# Patient Record
Sex: Female | Born: 1965
Health system: Southern US, Community
[De-identification: ages and names within clinical notes are randomized; demographics above are authoritative.]

## PROBLEM LIST (undated history)

## (undated) DIAGNOSIS — B009 Herpesviral infection, unspecified: Secondary | ICD-10-CM

## (undated) DIAGNOSIS — D219 Benign neoplasm of connective and other soft tissue, unspecified: Secondary | ICD-10-CM

## (undated) HISTORY — DX: Herpesviral infection, unspecified: B00.9

## (undated) HISTORY — DX: Benign neoplasm of connective and other soft tissue, unspecified: D21.9

---

## 1998-07-19 ENCOUNTER — Encounter: Payer: Self-pay | Admitting: Obstetrics & Gynecology

## 1998-07-19 ENCOUNTER — Ambulatory Visit (HOSPITAL_COMMUNITY): Admission: RE | Admit: 1998-07-19 | Discharge: 1998-07-19 | Payer: Self-pay | Admitting: Obstetrics & Gynecology

## 1999-07-18 ENCOUNTER — Other Ambulatory Visit: Admission: RE | Admit: 1999-07-18 | Discharge: 1999-07-18 | Payer: Self-pay | Admitting: Obstetrics & Gynecology

## 2000-07-16 ENCOUNTER — Other Ambulatory Visit: Admission: RE | Admit: 2000-07-16 | Discharge: 2000-07-16 | Payer: Self-pay | Admitting: Obstetrics and Gynecology

## 2001-06-30 ENCOUNTER — Other Ambulatory Visit: Admission: RE | Admit: 2001-06-30 | Discharge: 2001-06-30 | Payer: Self-pay | Admitting: Obstetrics and Gynecology

## 2002-06-29 ENCOUNTER — Other Ambulatory Visit: Admission: RE | Admit: 2002-06-29 | Discharge: 2002-06-29 | Payer: Self-pay | Admitting: Obstetrics and Gynecology

## 2003-05-23 ENCOUNTER — Encounter: Admission: RE | Admit: 2003-05-23 | Discharge: 2003-05-23 | Payer: Self-pay | Admitting: Obstetrics and Gynecology

## 2003-07-06 ENCOUNTER — Other Ambulatory Visit: Admission: RE | Admit: 2003-07-06 | Discharge: 2003-07-06 | Payer: Self-pay | Admitting: Obstetrics and Gynecology

## 2003-12-07 ENCOUNTER — Encounter: Admission: RE | Admit: 2003-12-07 | Discharge: 2003-12-07 | Payer: Self-pay | Admitting: Obstetrics and Gynecology

## 2004-06-04 ENCOUNTER — Encounter: Admission: RE | Admit: 2004-06-04 | Discharge: 2004-06-04 | Payer: Self-pay | Admitting: Obstetrics and Gynecology

## 2004-07-11 ENCOUNTER — Other Ambulatory Visit: Admission: RE | Admit: 2004-07-11 | Discharge: 2004-07-11 | Payer: Self-pay | Admitting: Obstetrics and Gynecology

## 2005-06-06 ENCOUNTER — Encounter: Admission: RE | Admit: 2005-06-06 | Discharge: 2005-06-06 | Payer: Self-pay | Admitting: Obstetrics and Gynecology

## 2005-07-14 ENCOUNTER — Other Ambulatory Visit: Admission: RE | Admit: 2005-07-14 | Discharge: 2005-07-14 | Payer: Self-pay | Admitting: Obstetrics and Gynecology

## 2005-08-17 ENCOUNTER — Inpatient Hospital Stay (HOSPITAL_COMMUNITY): Admission: AD | Admit: 2005-08-17 | Discharge: 2005-08-17 | Payer: Self-pay | Admitting: Obstetrics and Gynecology

## 2005-10-15 ENCOUNTER — Ambulatory Visit (HOSPITAL_COMMUNITY): Admission: RE | Admit: 2005-10-15 | Discharge: 2005-10-15 | Payer: Self-pay | Admitting: Obstetrics and Gynecology

## 2006-06-12 ENCOUNTER — Emergency Department (HOSPITAL_COMMUNITY): Admission: EM | Admit: 2006-06-12 | Discharge: 2006-06-12 | Payer: Self-pay | Admitting: Emergency Medicine

## 2006-06-13 ENCOUNTER — Emergency Department (HOSPITAL_COMMUNITY): Admission: EM | Admit: 2006-06-13 | Discharge: 2006-06-13 | Payer: Self-pay | Admitting: Emergency Medicine

## 2006-06-30 ENCOUNTER — Encounter: Admission: RE | Admit: 2006-06-30 | Discharge: 2006-06-30 | Payer: Self-pay | Admitting: Obstetrics and Gynecology

## 2007-08-06 ENCOUNTER — Encounter: Admission: RE | Admit: 2007-08-06 | Discharge: 2007-08-06 | Payer: Self-pay | Admitting: Obstetrics and Gynecology

## 2008-02-18 ENCOUNTER — Ambulatory Visit: Payer: Self-pay | Admitting: Sports Medicine

## 2008-02-18 DIAGNOSIS — M2170 Unequal limb length (acquired), unspecified site: Secondary | ICD-10-CM | POA: Insufficient documentation

## 2008-02-18 DIAGNOSIS — IMO0002 Reserved for concepts with insufficient information to code with codable children: Secondary | ICD-10-CM | POA: Insufficient documentation

## 2008-08-21 ENCOUNTER — Encounter: Admission: RE | Admit: 2008-08-21 | Discharge: 2008-08-21 | Payer: Self-pay | Admitting: Obstetrics and Gynecology

## 2009-06-23 DIAGNOSIS — D219 Benign neoplasm of connective and other soft tissue, unspecified: Secondary | ICD-10-CM

## 2009-06-23 HISTORY — DX: Benign neoplasm of connective and other soft tissue, unspecified: D21.9

## 2009-09-05 ENCOUNTER — Encounter: Admission: RE | Admit: 2009-09-05 | Discharge: 2009-09-05 | Payer: Self-pay | Admitting: Obstetrics and Gynecology

## 2010-07-13 ENCOUNTER — Other Ambulatory Visit: Payer: Self-pay | Admitting: Obstetrics and Gynecology

## 2010-07-13 DIAGNOSIS — Z1239 Encounter for other screening for malignant neoplasm of breast: Secondary | ICD-10-CM

## 2010-07-14 ENCOUNTER — Encounter: Payer: Self-pay | Admitting: Obstetrics and Gynecology

## 2010-09-11 ENCOUNTER — Other Ambulatory Visit: Payer: Self-pay | Admitting: Obstetrics and Gynecology

## 2010-09-11 ENCOUNTER — Ambulatory Visit
Admission: RE | Admit: 2010-09-11 | Discharge: 2010-09-11 | Disposition: A | Discharge status: Home / Self Care | Payer: 59 | Source: Ambulatory Visit | Attending: Obstetrics and Gynecology | Admitting: Obstetrics and Gynecology

## 2010-09-11 DIAGNOSIS — Z1231 Encounter for screening mammogram for malignant neoplasm of breast: Secondary | ICD-10-CM

## 2010-09-11 DIAGNOSIS — Z1239 Encounter for other screening for malignant neoplasm of breast: Secondary | ICD-10-CM

## 2010-10-03 ENCOUNTER — Ambulatory Visit
Admission: RE | Admit: 2010-10-03 | Discharge: 2010-10-03 | Disposition: A | Discharge status: Home / Self Care | Payer: 59 | Source: Ambulatory Visit | Attending: Obstetrics and Gynecology | Admitting: Obstetrics and Gynecology

## 2010-10-03 DIAGNOSIS — Z1231 Encounter for screening mammogram for malignant neoplasm of breast: Secondary | ICD-10-CM

## 2010-11-08 NOTE — Consult Note (Signed)
Stephanie Schneider, Stephanie Schneider NO.:  [id number]   MEDICAL RECORD NO.:  [id number]          PATIENT TYPE:  MAT   LOCATION:  [facility]                          FACILITY:  [facility]   PHYSICIAN:  Janine Limbo, M.D.DATE OF BIRTH:  Oct 28, 1965   DATE OF CONSULTATION:  08/17/2005  DATE OF DISCHARGE:                                   CONSULTATION   HISTORY OF PRESENT ILLNESS:  [name] is a 45 year old female who  presents for her first intrauterine insemination. She had appropriate  hormone surges. The patient's husband's post sperm wash analysis shows a  volume of 0.5 mL. There is 1+ forward progression. There are 33 million  sperm per milliliter. There is 42% motility.   The patient was seen at maternity admissions. Vital signs were all stable.  Exam was performed. The sample was placed in the uterus without difficulty.  The patient will remain supine for 30 minutes and will return to the office  in 10 days for examination and for pregnancy testing.      Janine Limbo, M.D.  Electronically Signed     AVS/MEDQ  D:  08/17/2005  T:  08/18/2005  Job:  [id number]

## 2011-08-29 ENCOUNTER — Other Ambulatory Visit: Payer: Self-pay | Admitting: Obstetrics and Gynecology

## 2011-08-29 DIAGNOSIS — Z1231 Encounter for screening mammogram for malignant neoplasm of breast: Secondary | ICD-10-CM

## 2011-09-10 DIAGNOSIS — B009 Herpesviral infection, unspecified: Secondary | ICD-10-CM | POA: Insufficient documentation

## 2011-09-10 DIAGNOSIS — E28319 Asymptomatic premature menopause: Secondary | ICD-10-CM | POA: Insufficient documentation

## 2011-10-01 ENCOUNTER — Ambulatory Visit (INDEPENDENT_AMBULATORY_CARE_PROVIDER_SITE_OTHER): Payer: 59 | Admitting: Obstetrics and Gynecology

## 2011-10-01 ENCOUNTER — Encounter: Payer: Self-pay | Admitting: Obstetrics and Gynecology

## 2011-10-01 ENCOUNTER — Ambulatory Visit
Admission: RE | Admit: 2011-10-01 | Discharge: 2011-10-01 | Disposition: A | Discharge status: Home / Self Care | Payer: 59 | Source: Ambulatory Visit | Attending: Obstetrics and Gynecology | Admitting: Obstetrics and Gynecology

## 2011-10-01 VITALS — BP 118/68 | HR 82 | Ht 67.0 in | Wt 151.0 lb

## 2011-10-01 DIAGNOSIS — Z1231 Encounter for screening mammogram for malignant neoplasm of breast: Secondary | ICD-10-CM

## 2011-10-01 DIAGNOSIS — Z124 Encounter for screening for malignant neoplasm of cervix: Secondary | ICD-10-CM

## 2011-10-01 DIAGNOSIS — Z01419 Encounter for gynecological examination (general) (routine) without abnormal findings: Secondary | ICD-10-CM

## 2011-10-01 MED ORDER — VALACYCLOVIR HCL 500 MG PO TABS: 500.0000 mg | ORAL_TABLET | Freq: Two times a day (BID) | ORAL | Status: DC | PRN

## 2011-10-01 NOTE — Progress Notes (Signed)
Subjective:    Stephanie Schneider is a 46 y.o. female, G3P1010, who presents for an annual exam. The patient reports:no complaints. FSH 35 05/2011    History  Sexual Activity  . Sexually Active: Yes -- Female partner(s)  . Birth Tax adviser: None  .  History  Smoking status  . Never Smoker   Smokeless tobacco  . Not on file  .  Last mammogram: was normal  2012 Last pap: was normal   2012  GC/Chlamydia cultures offered: declined HIV/RPR/HbsAg offered:  declined HSV 1 and 2 glycoprotein offered: declined  Menstrual cycle:   LMP: Patient's last menstrual period was 09/17/2011.           Cycle is monthly with normal flow and without intermenstrual bleeding or severe dysmenorrhea. Occasional heavy flow with cramping.   The following portions of the patient's history were reviewed and updated as appropriate: allergies, current medications, past family history, past medical history, past social history, past surgical history and problem list.  Review of Systems Pertinent items are noted in HPI. Breast:Negative for breast lump,nipple discharge or nipple retraction Gastrointestinal: positive for constipation worse with traveling Urinary:negative    Objective:    BP 118/68  Pulse 82  Ht 5\' 7"  (1.702 m)  Wt 151 lb (68.493 kg)  BMI 23.65 kg/m2  LMP 09/17/2011 Weight:  Wt Readings from Last 1 Encounters:  10/01/11 151 lb (68.493 kg)   BMI: Body mass index is 23.65 kg/(m^2). General Appearance: Alert, appropriate appearance for age. No acute distress HEENT: Grossly normal Neck / Thyroid: Supple, no masses, nodes or enlargement Lungs: clear to auscultation bilaterally Back: No CVA tenderness Breast Exam: No masses or nodes.No dimpling, nipple retraction or discharge. Cardiovascular: Regular rate and rhythm. S1, S2, no murmur Gastrointestinal: Soft, non-tender, no masses or organomegaly Pelvic Exam: External genitalia: normal general appearance Vaginal: normal mucosa  without prolapse or lesions Cervix: normal appearance Adnexa: normal bimanual exam Uterus: enlarged 10-12 weeks NSC Rectovaginal: normal rectal, no masses Lymphatic Exam: Non-palpable nodes in neck, clavicular, axillary, or inguinal regions Skin: no rash or abnormalities Neurologic: Normal gait and speech, no tremor   Psychiatric: Alert and oriented, appropriate affect.   Wet Prep:not applicable Urinalysis:not applicable UPT: Not done   Assessment:    Normal gyn exam    Plan:    mammogram pap smear STD screening: declined Contraception:no method      Jadon Ressler AMD

## 2011-10-02 MED ORDER — VALACYCLOVIR HCL 500 MG PO TABS: 500.0000 mg | ORAL_TABLET | Freq: Two times a day (BID) | ORAL | Status: DC | PRN

## 2011-10-02 MED ORDER — VALACYCLOVIR HCL 500 MG PO TABS: 500.0000 mg | ORAL_TABLET | Freq: Two times a day (BID) | ORAL | Status: AC | PRN

## 2011-10-02 NOTE — Progress Notes (Signed)
Addended by: Larwance Rote on: 10/02/2011 11:53 AM   Modules accepted: Orders

## 2011-10-03 LAB — PAP IG W/ RFLX HPV ASCU

## 2012-05-25 ENCOUNTER — Telehealth: Payer: Self-pay | Admitting: Obstetrics and Gynecology

## 2012-05-25 NOTE — Telephone Encounter (Signed)
Per SR. I called in RF's on Boric acid 600 mg caps. #24 with prn RF's through 09/2012 to use as directed. Melody Comas A

## 2012-05-27 ENCOUNTER — Telehealth: Payer: Self-pay

## 2012-05-27 NOTE — Telephone Encounter (Signed)
Late entry for 05/25/12 Refills called in to [facility] through 09/2012 for the Boric acid  600 mg caps #24  To use as directed previously. Per SR. Melody Comas A

## 2012-06-30 ENCOUNTER — Other Ambulatory Visit: Payer: Self-pay | Admitting: Obstetrics and Gynecology

## 2012-06-30 DIAGNOSIS — Z1231 Encounter for screening mammogram for malignant neoplasm of breast: Secondary | ICD-10-CM

## 2012-08-07 ENCOUNTER — Other Ambulatory Visit: Payer: Self-pay

## 2012-10-06 ENCOUNTER — Ambulatory Visit
Admission: RE | Admit: 2012-10-06 | Discharge: 2012-10-06 | Disposition: A | Discharge status: Home / Self Care | Payer: 59 | Source: Ambulatory Visit | Attending: Obstetrics and Gynecology | Admitting: Obstetrics and Gynecology

## 2012-10-06 ENCOUNTER — Other Ambulatory Visit (HOSPITAL_COMMUNITY)
Admission: RE | Admit: 2012-10-06 | Discharge: 2012-10-06 | Disposition: A | Discharge status: Home / Self Care | Payer: 59 | Source: Ambulatory Visit | Attending: Obstetrics and Gynecology | Admitting: Obstetrics and Gynecology

## 2012-10-06 ENCOUNTER — Other Ambulatory Visit: Payer: Self-pay | Admitting: Obstetrics and Gynecology

## 2012-10-06 DIAGNOSIS — Z01419 Encounter for gynecological examination (general) (routine) without abnormal findings: Secondary | ICD-10-CM | POA: Insufficient documentation

## 2012-10-06 DIAGNOSIS — Z1231 Encounter for screening mammogram for malignant neoplasm of breast: Secondary | ICD-10-CM

## 2012-10-06 DIAGNOSIS — Z1151 Encounter for screening for human papillomavirus (HPV): Secondary | ICD-10-CM | POA: Insufficient documentation

## 2013-04-28 ENCOUNTER — Other Ambulatory Visit: Payer: Self-pay

## 2013-08-30 ENCOUNTER — Other Ambulatory Visit: Payer: Self-pay

## 2013-08-30 DIAGNOSIS — Z1231 Encounter for screening mammogram for malignant neoplasm of breast: Secondary | ICD-10-CM

## 2013-10-12 ENCOUNTER — Ambulatory Visit
Admission: RE | Admit: 2013-10-12 | Discharge: 2013-10-12 | Disposition: A | Discharge status: Home / Self Care | Payer: 59 | Source: Ambulatory Visit

## 2013-10-12 DIAGNOSIS — Z1231 Encounter for screening mammogram for malignant neoplasm of breast: Secondary | ICD-10-CM

## 2014-04-24 ENCOUNTER — Encounter: Payer: Self-pay | Admitting: Obstetrics and Gynecology

## 2014-09-06 ENCOUNTER — Other Ambulatory Visit: Payer: Self-pay

## 2014-09-06 DIAGNOSIS — Z1231 Encounter for screening mammogram for malignant neoplasm of breast: Secondary | ICD-10-CM

## 2014-10-16 ENCOUNTER — Ambulatory Visit
Admission: RE | Admit: 2014-10-16 | Discharge: 2014-10-16 | Disposition: A | Discharge status: Home / Self Care | Payer: 59 | Source: Ambulatory Visit

## 2014-10-16 DIAGNOSIS — Z1231 Encounter for screening mammogram for malignant neoplasm of breast: Secondary | ICD-10-CM

## 2014-11-19 ENCOUNTER — Telehealth: Payer: Self-pay | Admitting: Family

## 2014-11-19 DIAGNOSIS — R059 Cough, unspecified: Secondary | ICD-10-CM

## 2014-11-19 DIAGNOSIS — R05 Cough: Secondary | ICD-10-CM

## 2014-11-19 MED ORDER — BENZONATATE 100 MG PO CAPS: 100.0000 mg | ORAL_CAPSULE | Freq: Three times a day (TID) | ORAL | Status: DC | PRN

## 2014-11-19 NOTE — Progress Notes (Signed)
We are sorry that you are not feeling well.  Here is how we plan to help!  Based on what you have shared with me it looks like you have upper respiratory tract inflammation that has resulted in a significant cough.  Inflammation and infection in the upper respiratory tract is commonly called bronchitis and has four common causes:  Allergies, Viral Infections, Acid Reflux and Bacterial Infections.  Allergies, viruses and acid reflux are treated by controlling symptoms or eliminating the cause. An example might be a cough caused by taking certain blood pressure medications. You stop the cough by changing the medication. Another example might be a cough caused by acid reflux. Controlling the reflux helps control the cough.  Based on your presentation I believe you most likely have A cough due to a virus.  This is called viral bronchitis and is best treated by rest, plenty of fluids and control of the cough.  You may use Ibuprofen or Tylenol as directed to help your symptoms.    In addition you may use A non-prescription cough medication called Robitussin DAC. Take 2 teaspoons every 8 hours or Delsym: take 2 teaspoons every 12 hours., A non-prescription cough medication called Mucinex DM: take 2 tablets every 12 hours. and A prescription cough medication called Tessalon Perles 100mg. You may take 1-2 capsules every 8 hours as needed for your cough.    HOME CARE . Only take medications as instructed by your medical team. . Complete the entire course of an antibiotic. . Drink plenty of fluids and get plenty of rest. . Avoid close contacts especially the very young and the elderly . Cover your mouth if you cough or cough into your sleeve. . Always remember to wash your hands . A steam or ultrasonic humidifier can help congestion.    GET HELP RIGHT AWAY IF: . You develop worsening fever. . You become short of breath . You cough up blood. . Your symptoms persist after you have completed your treatment  plan MAKE SURE YOU   Understand these instructions.  Will watch your condition.  Will get help right away if you are not doing well or get worse.  Your e-visit answers were reviewed by a board certified advanced clinical practitioner to complete your personal care plan.  Depending on the condition, your plan could have included both over the counter or prescription medications.  If there is a problem please reply  once you have received a response from your provider.  Your safety is important to us.  If you have drug allergies check your prescription carefully.    You can use MyChart to ask questions about today's visit, request a non-urgent call back, or ask for a work or school excuse.  You will get an e-mail in the next two days asking about your experience.  I hope that your e-visit has been valuable and will speed your recovery. Thank you for using e-visits.   

## 2014-12-19 ENCOUNTER — Ambulatory Visit (INDEPENDENT_AMBULATORY_CARE_PROVIDER_SITE_OTHER): Payer: 59 | Admitting: Family Medicine

## 2014-12-19 ENCOUNTER — Encounter: Payer: Self-pay | Admitting: Family Medicine

## 2014-12-19 VITALS — BP 125/85 | HR 82 | Ht 67.0 in | Wt 145.0 lb

## 2014-12-19 DIAGNOSIS — M25552 Pain in left hip: Secondary | ICD-10-CM | POA: Diagnosis not present

## 2014-12-19 DIAGNOSIS — M25551 Pain in right hip: Secondary | ICD-10-CM

## 2014-12-19 DIAGNOSIS — F419 Anxiety disorder, unspecified: Secondary | ICD-10-CM | POA: Insufficient documentation

## 2014-12-19 DIAGNOSIS — G43909 Migraine, unspecified, not intractable, without status migrainosus: Secondary | ICD-10-CM | POA: Insufficient documentation

## 2014-12-19 NOTE — Assessment & Plan Note (Signed)
location, description consistent with snapping hip syndrome of hip flexors.  Shown home exercises and stretches to do daily.  Tylenol, motrin, icing if needed.  F/u in 6 weeks.  Consider PT if not improving.

## 2014-12-19 NOTE — Progress Notes (Signed)
PCP: Patria Mane, MD  Subjective:   HPI: Patient is a 49 y.o. female here for bilateral hip pain.  Patient reports she's been having off and on bilateral hip pain mainly with extreme hip flexion. Plans to start running again soon and wants to make sure nothing bad is going on. No swelling or bruising. No known injury. Feels like it will cramp sometimes. Can get up to 3/10 pain when it comes on but pain does not linger. No radiation. No numbness or tingling.  Past Medical History  Diagnosis Date  . Fibroid 2011    asymptomatic  . HSV-2 infection     Current Outpatient Prescriptions on File Prior to Visit  Medication Sig Dispense Refill  . ALPRAZolam (XANAX) 0.25 MG tablet Take 0.25 mg by mouth as needed.    . SUMAtriptan (IMITREX) 100 MG tablet Take 50 mg by mouth as needed.    . triamcinolone (NASACORT) 55 MCG/ACT nasal inhaler Place 1 spray into the nose daily.     No current facility-administered medications on file prior to visit.    No past surgical history on file.  No Known Allergies  History   Social History  . Marital Status: Married    Spouse Name: N/A  . Number of Children: N/A  . Years of Education: N/A   Occupational History  . Not on file.   Social History Main Topics  . Smoking status: Never Smoker   . Smokeless tobacco: Not on file  . Alcohol Use: No  . Drug Use: No  . Sexual Activity:    Partners: Male    Birth Control/ Protection: None   Other Topics Concern  . Not on file   Social History Narrative    Family History  Problem Relation Age of Onset  . Breast cancer Mother 3    BP 125/85 mmHg  Pulse 82  Ht 5\' 7"  (1.702 m)  Wt 145 lb (65.772 kg)  BMI 22.71 kg/m2  Review of Systems: See HPI above.    Objective:  Physical Exam:  Gen: NAD  Back/hips: No gross deformity, scoliosis. No TTP.  No midline or bony TTP. FROM hips and back without pain.   Strength LEs 5/5 all muscle groups except 5-/5 bilateral hip abduction.   No pain with any motions.   2+ MSRs in patellar and achilles tendons, equal bilaterally. Negative SLRs. Sensation intact to light touch bilaterally. Negative logroll bilateral hips Negative fabers and piriformis stretches. Leg lengths equal.  Assessment & Plan:  1. Bilateral hip pain - location, description consistent with snapping hip syndrome of hip flexors.  Shown home exercises and stretches to do daily.  Tylenol, motrin, icing if needed.  F/u in 6 weeks.  Consider PT if not improving.

## 2014-12-19 NOTE — Patient Instructions (Signed)
You are describing snapping hip syndrome. Your exam is very reassuring. I would not put any restrictions on your running. Hip flexor stretches 2-3 times, hold 20-30 seconds. Standing hip rotations, straight leg raises, hip side raises 3 sets of 10 once a day. Add ankle weights if these become too easy. Tylenol, motrin, icing only if needed. Follow up with me in 6 weeks. Can consider physical therapy if you're struggling.

## 2015-08-15 MED FILL — NASACORT ALLERGY 24HR SPRAY: 55 MCG | 30 days supply | Qty: 11 | Fill #0 | Status: TO

## 2015-08-16 MED FILL — AMOX TR-K CLV 875-125 MG TA: 875-125 | 10 days supply | Qty: 20 | Fill #0

## 2015-09-11 ENCOUNTER — Other Ambulatory Visit: Payer: Self-pay

## 2015-09-11 DIAGNOSIS — Z1231 Encounter for screening mammogram for malignant neoplasm of breast: Secondary | ICD-10-CM

## 2015-09-17 MED FILL — OSELTAMIVIR PHOS 75 MG CAP: 75 | 5 days supply | Qty: 10 | Fill #0

## 2015-09-17 MED FILL — PROMETHAZINE-CODEINE SYRUP: 6.25-10 | 5 days supply | Qty: 100 | Fill #0

## 2015-10-11 MED FILL — NASACORT ALLERGY 24HR SPRAY: 55 MCG | 30 days supply | Qty: 11 | Fill #0

## 2015-10-18 ENCOUNTER — Other Ambulatory Visit: Payer: Self-pay | Admitting: Obstetrics and Gynecology

## 2015-10-18 ENCOUNTER — Other Ambulatory Visit (HOSPITAL_COMMUNITY)
Admission: RE | Admit: 2015-10-18 | Discharge: 2015-10-18 | Disposition: A | Discharge status: Home / Self Care | Payer: 59 | Source: Ambulatory Visit | Attending: Obstetrics and Gynecology | Admitting: Obstetrics and Gynecology

## 2015-10-18 ENCOUNTER — Ambulatory Visit
Admission: RE | Admit: 2015-10-18 | Discharge: 2015-10-18 | Disposition: A | Discharge status: Home / Self Care | Payer: 59 | Source: Ambulatory Visit

## 2015-10-18 DIAGNOSIS — Z1231 Encounter for screening mammogram for malignant neoplasm of breast: Secondary | ICD-10-CM

## 2015-10-18 DIAGNOSIS — Z1151 Encounter for screening for human papillomavirus (HPV): Secondary | ICD-10-CM | POA: Diagnosis present

## 2015-10-18 DIAGNOSIS — Z01419 Encounter for gynecological examination (general) (routine) without abnormal findings: Secondary | ICD-10-CM | POA: Diagnosis present

## 2015-10-19 LAB — CYTOLOGY - PAP

## 2015-12-04 MED FILL — NASACORT ALLERGY 24HR SPRAY: 55 MCG | 30 days supply | Qty: 11 | Fill #1

## 2015-12-26 MED FILL — CLOBETASOL 0.05% SOLUTION: 0.05 | 15 days supply | Qty: 25 | Fill #0

## 2016-01-04 MED FILL — VALACYCLOVIR HCL 500 MG TAB: 500 | 7 days supply | Qty: 30 | Fill #0

## 2016-02-18 MED FILL — TRIAMCINOLONE 55 MCG NASAL: 55 | 60 days supply | Qty: 17 | Fill #2

## 2016-03-18 MED FILL — LOTEMAX 0.5% GEL: 0.5 | 25 days supply | Qty: 5 | Fill #0

## 2016-04-08 MED FILL — HYDROCODONE-HOMATROPINE SYR: 5-1.5 | 6 days supply | Qty: 120 | Fill #0

## 2016-05-05 MED FILL — BENZONATATE 100 MG CAPSULE: 100 | 7 days supply | Qty: 21 | Fill #0

## 2016-05-05 MED FILL — AMOX-CLAV 875-125 MG TABLET: 875-125 | 10 days supply | Qty: 20 | Fill #0

## 2016-06-10 MED FILL — TRIAMCINOLONE 55 MCG NASAL: 55 | 60 days supply | Qty: 17 | Fill #3

## 2016-07-17 MED FILL — OSELTAMIVIR PHOS 75 MG CAP: 75 | 10 days supply | Qty: 10 | Fill #0

## 2016-08-13 MED FILL — TRIAMCINOLONE 55 MCG NASAL: 55 | 30 days supply | Qty: 17 | Fill #4

## 2016-08-18 DIAGNOSIS — H6122 Impacted cerumen, left ear: Secondary | ICD-10-CM | POA: Diagnosis not present

## 2016-08-18 DIAGNOSIS — H9012 Conductive hearing loss, unilateral, left ear, with unrestricted hearing on the contralateral side: Secondary | ICD-10-CM | POA: Diagnosis not present

## 2016-09-03 ENCOUNTER — Encounter (INDEPENDENT_AMBULATORY_CARE_PROVIDER_SITE_OTHER): Payer: Self-pay

## 2016-09-03 ENCOUNTER — Encounter: Payer: Self-pay | Admitting: Family Medicine

## 2016-09-03 ENCOUNTER — Ambulatory Visit (INDEPENDENT_AMBULATORY_CARE_PROVIDER_SITE_OTHER): Payer: 59 | Admitting: Family Medicine

## 2016-09-03 DIAGNOSIS — M25532 Pain in left wrist: Secondary | ICD-10-CM

## 2016-09-03 DIAGNOSIS — M25531 Pain in right wrist: Secondary | ICD-10-CM | POA: Diagnosis not present

## 2016-09-03 DIAGNOSIS — M25551 Pain in right hip: Secondary | ICD-10-CM | POA: Diagnosis not present

## 2016-09-03 NOTE — Patient Instructions (Signed)
Your wrist pain is suggestive of a small TFCC tear of your wrist. Wear the wrist brace on right as often as possible for the next 6 weeks. Icing, tylenol or aleve only if needed. If not improving only then would I consider occupational therapy, MRI.  Your hip pain is due to trochanteric bursitis. Avoid painful activities as much as possible (stairs, inclines). Ice over area of pain 3-4 times a day for 15 minutes at a time Hip side raise exercise, standing hip rotations 3 sets of 10-15 once a day - add weights if these become too easy. Stretches - pick 2 and hold for 20-30 seconds x 3 - do once or twice a day. Regular aleve or ibuprofen for 7-10 days then as needed. If not improving, can consider physical therapy and/or steroid injection. Follow up with me in 6 weeks but can call me sooner if you're having problems.

## 2016-09-04 DIAGNOSIS — M25531 Pain in right wrist: Secondary | ICD-10-CM | POA: Insufficient documentation

## 2016-09-04 DIAGNOSIS — M25532 Pain in left wrist: Principal | ICD-10-CM

## 2016-09-04 NOTE — Assessment & Plan Note (Signed)
2/2 trochanteric bursitis. Shown home exercises and stretches to do daily.  Icing, tylenol or aleve if needed.  Consider physical therapy, injection if not improving.  f/u in 6 weeks.

## 2016-09-04 NOTE — Progress Notes (Signed)
PCP: Lilian Coma, MD  Subjective:   HPI: Patient is a 51 y.o. female here for right hip, bilateral wrist pain.  Patient reports she's had problems with both wrists for several months off and on. Right wrist worse than left. Comes and goes. No clicking or popping. Feels more on ulnar side of the wrist. Pain currently 0/10 level. Both feel weak. Worse when opening jars. No swelling. Also with right hip pain since Sunday.  Was lying on back in bed when had a sharp pain lateral right hip. Pain only comes on for 5 seconds at a time and goes away. Does not come on with any motions in particular. No numbness or tingling. No radiation of pain. No skin changes.  Past Medical History:  Diagnosis Date  . Fibroid 2011   asymptomatic  . HSV-2 infection     Current Outpatient Prescriptions on File Prior to Visit  Medication Sig Dispense Refill  . ALPRAZolam (XANAX) 0.25 MG tablet Take 0.25 mg by mouth as needed.    . SUMAtriptan (IMITREX) 100 MG tablet Take 50 mg by mouth as needed.     No current facility-administered medications on file prior to visit.     No past surgical history on file.  No Known Allergies  Social History   Social History  . Marital status: Married    Spouse name: N/A  . Number of children: N/A  . Years of education: N/A   Occupational History  . Not on file.   Social History Main Topics  . Smoking status: Never Smoker  . Smokeless tobacco: Never Used  . Alcohol use No  . Drug use: No  . Sexual activity: Yes    Partners: Male    Birth control/ protection: None   Other Topics Concern  . Not on file   Social History Narrative  . No narrative on file    Family History  Problem Relation Age of Onset  . Breast cancer Mother 37    BP (!) 133/96   Pulse 69   Ht 5\' 7"  (1.702 m)   Wt 155 lb (70.3 kg)   BMI 24.28 kg/m   Review of Systems: See HPI above.     Objective:  Physical Exam:  Gen: NAD, comfortable in exam  room  Right hip/back: No gross deformity, scoliosis. TTP right greater trochanter.  No midline or bony TTP.  No other hip tenderness. FROM without pain. Strength LEs 5/5 all muscle groups including hip abduction.   Negative SLRs. Sensation intact to light touch bilaterally. Negative logroll bilateral hips Negative fabers and piriformis stretches.   Bilateral wrists: No gross deformity, swelling, bruising. No tenderness (points to about TFCC as area of pain when occurs). FROM wrists, digits without pain. 5/5 strength wrists and digits. Negative finkelsteins, phalens, tinels. NVI distally.  Assessment & Plan:  1. Right hip pain - 2/2 trochanteric bursitis. Shown home exercises and stretches to do daily.  Icing, tylenol or aleve if needed.  Consider physical therapy, injection if not improving.  f/u in 6 weeks.  2. Bilateral wrist pain - mainly on the right - suggestive of TFCC tear on right vs tendinitis.  Start with wrist brace regularly.  Icing, tylenol or ibuprofen only if needed.  Consider occupational therapy, MRI if not improving.

## 2016-09-04 NOTE — Assessment & Plan Note (Signed)
mainly on the right - suggestive of TFCC tear on right vs tendinitis.  Start with wrist brace regularly.  Icing, tylenol or ibuprofen only if needed.  Consider occupational therapy, MRI if not improving.

## 2016-09-05 ENCOUNTER — Other Ambulatory Visit: Payer: Self-pay | Admitting: Obstetrics and Gynecology

## 2016-09-05 DIAGNOSIS — Z1231 Encounter for screening mammogram for malignant neoplasm of breast: Secondary | ICD-10-CM

## 2016-09-18 MED FILL — VALACYCLOVIR HCL 500 MG TAB: 500 | 7 days supply | Qty: 30 | Fill #1 | Status: TO

## 2016-10-13 MED FILL — SUMATRIPTAN SUCC 100 MG TAB: 100 | 30 days supply | Qty: 9 | Fill #0 | Status: TO

## 2016-10-16 DIAGNOSIS — D509 Iron deficiency anemia, unspecified: Secondary | ICD-10-CM | POA: Diagnosis not present

## 2016-10-16 DIAGNOSIS — Z0000 Encounter for general adult medical examination without abnormal findings: Secondary | ICD-10-CM | POA: Diagnosis not present

## 2016-10-16 DIAGNOSIS — Z13220 Encounter for screening for lipoid disorders: Secondary | ICD-10-CM | POA: Diagnosis not present

## 2016-10-16 DIAGNOSIS — Z23 Encounter for immunization: Secondary | ICD-10-CM | POA: Diagnosis not present

## 2016-10-16 MED FILL — INTEGRA CAPSULE: 62.5-62.5-4 | 30 days supply | Qty: 30 | Fill #0

## 2016-10-17 MED FILL — NASACORT ALLERGY 24HR SPRAY: 55 MCG | 30 days supply | Qty: 11 | Fill #0 | Status: TO

## 2016-10-20 ENCOUNTER — Ambulatory Visit
Admission: RE | Admit: 2016-10-20 | Discharge: 2016-10-20 | Disposition: A | Discharge status: Home / Self Care | Payer: 59 | Source: Ambulatory Visit | Attending: Obstetrics and Gynecology | Admitting: Obstetrics and Gynecology

## 2016-10-20 DIAGNOSIS — Z1231 Encounter for screening mammogram for malignant neoplasm of breast: Secondary | ICD-10-CM | POA: Diagnosis not present

## 2016-10-24 DIAGNOSIS — Z01419 Encounter for gynecological examination (general) (routine) without abnormal findings: Secondary | ICD-10-CM | POA: Diagnosis not present

## 2016-11-01 DIAGNOSIS — J309 Allergic rhinitis, unspecified: Secondary | ICD-10-CM | POA: Diagnosis not present

## 2016-11-01 DIAGNOSIS — J069 Acute upper respiratory infection, unspecified: Secondary | ICD-10-CM | POA: Diagnosis not present

## 2016-11-09 MED FILL — NASACORT ALLERGY 24HR SPRAY: 55 MCG | 30 days supply | Qty: 11 | Fill #0

## 2016-12-09 DIAGNOSIS — M791 Myalgia: Secondary | ICD-10-CM | POA: Diagnosis not present

## 2016-12-09 DIAGNOSIS — B349 Viral infection, unspecified: Secondary | ICD-10-CM | POA: Diagnosis not present

## 2016-12-26 MED FILL — SUMATRIPTAN SUCC 100 MG TAB: 100 | 30 days supply | Qty: 9 | Fill #0

## 2017-01-05 DIAGNOSIS — L659 Nonscarring hair loss, unspecified: Secondary | ICD-10-CM | POA: Diagnosis not present

## 2017-01-05 DIAGNOSIS — L661 Lichen planopilaris: Secondary | ICD-10-CM | POA: Diagnosis not present

## 2017-01-05 DIAGNOSIS — L658 Other specified nonscarring hair loss: Secondary | ICD-10-CM | POA: Diagnosis not present

## 2017-01-05 MED FILL — CLOBETASOL 0.05% SOLUTION: 0.05 | 20 days supply | Qty: 25 | Fill #0

## 2017-01-05 MED FILL — HYDROXYCHLOROQUINE 200 MG T: 200 | 30 days supply | Qty: 60 | Fill #0

## 2017-01-19 MED FILL — VALACYCLOVIR HCL 500 MG TAB: 500 | 8 days supply | Qty: 30 | Fill #0

## 2017-01-22 MED FILL — AZITHROMYCIN 250 MG TABLET: 250 | 5 days supply | Qty: 6 | Fill #0

## 2017-02-02 MED FILL — CULTURELLE CAPSULE: 30 days supply | Qty: 30 | Fill #0

## 2017-02-05 DIAGNOSIS — L659 Nonscarring hair loss, unspecified: Secondary | ICD-10-CM | POA: Diagnosis not present

## 2017-03-17 MED FILL — NASACORT ALLERGY 24HR SPRAY: 55 MCG | 30 days supply | Qty: 11 | Fill #0 | Status: TO

## 2017-03-20 MED FILL — CLOBETASOL 0.05% SOLUTION: 0.05 | 20 days supply | Qty: 25 | Fill #1

## 2017-03-28 DIAGNOSIS — K219 Gastro-esophageal reflux disease without esophagitis: Secondary | ICD-10-CM | POA: Diagnosis not present

## 2017-04-01 MED FILL — CULTURELLE CAPSULE: 30 days supply | Qty: 30 | Fill #1

## 2017-04-27 DIAGNOSIS — K219 Gastro-esophageal reflux disease without esophagitis: Secondary | ICD-10-CM | POA: Diagnosis not present

## 2017-04-27 MED FILL — ALPRAZolam 0.25 MG TABS: 0.25 | 30 days supply | Qty: 30 | Fill #0

## 2017-04-27 MED FILL — raNITIdine HCL 150 MG TABS: 150 | 30 days supply | Qty: 60 | Fill #0

## 2017-05-05 MED FILL — NASACORT ALLERGY 24HR SPRAY: 55 MCG | 30 days supply | Qty: 11 | Fill #0

## 2017-05-11 DIAGNOSIS — L661 Lichen planopilaris: Secondary | ICD-10-CM | POA: Diagnosis not present

## 2017-05-11 DIAGNOSIS — L658 Other specified nonscarring hair loss: Secondary | ICD-10-CM | POA: Diagnosis not present

## 2017-05-11 MED FILL — FINASTERIDE 5 MG TABLET: 5 | 30 days supply | Qty: 15 | Fill #0

## 2017-05-11 MED FILL — CLOBETASOL 0.05% SOLUTION: 0.05 | 20 days supply | Qty: 25 | Fill #0

## 2017-05-22 MED FILL — AMOX-CLAV 875-125 MG TABLET: 875-125 | 10 days supply | Qty: 20 | Fill #0

## 2017-06-04 DIAGNOSIS — J329 Chronic sinusitis, unspecified: Secondary | ICD-10-CM | POA: Diagnosis not present

## 2017-06-04 MED FILL — CULTURELLE CAPSULE: 30 days supply | Qty: 30 | Fill #2

## 2017-06-04 MED FILL — AZITHROMYCIN 250 MG TABLET: 250 | 5 days supply | Qty: 6 | Fill #0

## 2017-06-04 MED FILL — raNITIdine HCL 150 MG TABS: 150 | 30 days supply | Qty: 60 | Fill #1

## 2017-06-22 MED FILL — VALACYCLOVIR HCL 500 MG TAB: 500 | 8 days supply | Qty: 30 | Fill #1

## 2017-06-26 MED FILL — PANTOPRAZOLE SOD DR 40 MG T: 40 | 30 days supply | Qty: 30 | Fill #0

## 2017-07-06 MED FILL — NASACORT ALLERGY 24HR SPRAY: 55 MCG | 30 days supply | Qty: 11 | Fill #1

## 2017-07-10 DIAGNOSIS — K121 Other forms of stomatitis: Secondary | ICD-10-CM | POA: Diagnosis not present

## 2017-07-10 DIAGNOSIS — K120 Recurrent oral aphthae: Secondary | ICD-10-CM | POA: Diagnosis not present

## 2017-07-13 MED FILL — raNITIdine HCL 150 MG TABS: 150 | 30 days supply | Qty: 60 | Fill #2

## 2017-07-30 MED FILL — FINASTERIDE 5 MG TABLET: 5 | 30 days supply | Qty: 15 | Fill #1

## 2017-08-03 MED FILL — CULTURELLE CAPSULE: 30 days supply | Qty: 30 | Fill #3

## 2017-08-04 MED FILL — NASACORT ALLERGY 24HR SPRAY: 55 MCG | 30 days supply | Qty: 11 | Fill #2

## 2017-08-07 MED FILL — OSELTAMIVIR PHOSPHATE 75 MG: 75 | 5 days supply | Qty: 10 | Fill #0

## 2017-08-11 DIAGNOSIS — J209 Acute bronchitis, unspecified: Secondary | ICD-10-CM | POA: Diagnosis not present

## 2017-08-11 MED FILL — AZITHROMYCIN 250 MG TABLET: 250 | 5 days supply | Qty: 6 | Fill #0

## 2017-08-14 MED FILL — CLOBETASOL 0.05% SOLUTION: 0.05 | 20 days supply | Qty: 25 | Fill #1

## 2017-08-17 MED FILL — raNITIdine HCL 150 MG TABS: 150 | 30 days supply | Qty: 60 | Fill #3

## 2017-09-14 ENCOUNTER — Other Ambulatory Visit: Payer: Self-pay | Admitting: Obstetrics and Gynecology

## 2017-09-14 DIAGNOSIS — Z1231 Encounter for screening mammogram for malignant neoplasm of breast: Secondary | ICD-10-CM

## 2017-09-24 MED FILL — raNITIdine HCL 150 MG TABS: 150 | 30 days supply | Qty: 60 | Fill #4

## 2017-09-25 MED FILL — CULTURELLE CAPSULE: 30 days supply | Qty: 30 | Fill #4

## 2017-09-28 MED FILL — FINASTERIDE 5 MG TABLET: 5 | 30 days supply | Qty: 15 | Fill #2

## 2017-10-06 MED FILL — NASACORT ALLERGY 24HR SPRAY: 55 MCG | 30 days supply | Qty: 11 | Fill #3

## 2017-10-21 ENCOUNTER — Ambulatory Visit
Admission: RE | Admit: 2017-10-21 | Discharge: 2017-10-21 | Disposition: A | Discharge status: Home / Self Care | Payer: 59 | Source: Ambulatory Visit | Attending: Obstetrics and Gynecology | Admitting: Obstetrics and Gynecology

## 2017-10-21 DIAGNOSIS — Z1231 Encounter for screening mammogram for malignant neoplasm of breast: Secondary | ICD-10-CM | POA: Diagnosis not present

## 2017-10-26 DIAGNOSIS — Z01419 Encounter for gynecological examination (general) (routine) without abnormal findings: Secondary | ICD-10-CM | POA: Diagnosis not present

## 2017-11-03 DIAGNOSIS — Z5181 Encounter for therapeutic drug level monitoring: Secondary | ICD-10-CM | POA: Diagnosis not present

## 2017-11-03 DIAGNOSIS — Z0000 Encounter for general adult medical examination without abnormal findings: Secondary | ICD-10-CM | POA: Diagnosis not present

## 2017-11-03 DIAGNOSIS — Z23 Encounter for immunization: Secondary | ICD-10-CM | POA: Diagnosis not present

## 2017-11-03 DIAGNOSIS — R5382 Chronic fatigue, unspecified: Secondary | ICD-10-CM | POA: Diagnosis not present

## 2017-11-03 DIAGNOSIS — D509 Iron deficiency anemia, unspecified: Secondary | ICD-10-CM | POA: Diagnosis not present

## 2017-11-03 DIAGNOSIS — E785 Hyperlipidemia, unspecified: Secondary | ICD-10-CM | POA: Diagnosis not present

## 2017-11-06 MED FILL — CLOBETASOL 0.05% SOLUTION: 0.05 | 20 days supply | Qty: 25 | Fill #2

## 2017-11-09 DIAGNOSIS — L661 Lichen planopilaris: Secondary | ICD-10-CM | POA: Diagnosis not present

## 2017-11-09 DIAGNOSIS — L658 Other specified nonscarring hair loss: Secondary | ICD-10-CM | POA: Diagnosis not present

## 2017-11-09 MED FILL — FINASTERIDE 5 MG TABLET: 5 | 30 days supply | Qty: 30 | Fill #0

## 2017-11-18 MED FILL — raNITIdine HCL 150 MG TABS: 150 | 30 days supply | Qty: 60 | Fill #5

## 2017-11-20 MED FILL — FLUCONAZOLE 150 MG TABS: 150 | 6 days supply | Qty: 2 | Fill #0

## 2017-11-25 DIAGNOSIS — J3489 Other specified disorders of nose and nasal sinuses: Secondary | ICD-10-CM | POA: Diagnosis not present

## 2017-11-25 MED FILL — AZITHROMYCIN 250 MG TABLET: 250 | 5 days supply | Qty: 6 | Fill #0

## 2017-11-25 MED FILL — TRIAMCINOLONE ACETONIDE 55: 55 | 30 days supply | Qty: 17 | Fill #4

## 2017-11-25 MED FILL — predniSONE 20 MG TABS: 20 | 5 days supply | Qty: 10 | Fill #0

## 2017-12-04 MED FILL — LOTEMAX 0.5% EYE DROPS: 0.5 | 25 days supply | Qty: 5 | Fill #0

## 2017-12-14 MED FILL — CULTURELLE CAPSULE: 30 days supply | Qty: 30 | Fill #0

## 2018-02-02 MED FILL — CLOBETASOL 0.05% SOLUTION: 0.05 | 20 days supply | Qty: 25 | Fill #0

## 2018-02-02 MED FILL — SUMATRIPTAN SUCC 100 MG TAB: 100 | 30 days supply | Qty: 9 | Fill #0

## 2018-02-02 MED FILL — TRIAMCINOLONE ACETONIDE 55: 55 | 60 days supply | Qty: 17 | Fill #0

## 2018-02-05 MED FILL — VALACYCLOVIR HCL 500 MG TAB: 500 | 8 days supply | Qty: 30 | Fill #0

## 2018-02-23 MED FILL — CULTURELLE CAPSULE: 30 days supply | Qty: 30 | Fill #1

## 2018-03-04 MED FILL — raNITIdine HCL 150 MG TABS: 150 | 30 days supply | Qty: 60 | Fill #6

## 2018-04-20 MED FILL — TRIAMCINOLONE ACETONIDE 55: 55 | 60 days supply | Qty: 17 | Fill #1

## 2018-05-13 DIAGNOSIS — L661 Lichen planopilaris: Secondary | ICD-10-CM | POA: Insufficient documentation

## 2018-05-13 DIAGNOSIS — L658 Other specified nonscarring hair loss: Secondary | ICD-10-CM | POA: Diagnosis not present

## 2018-05-18 DIAGNOSIS — G43909 Migraine, unspecified, not intractable, without status migrainosus: Secondary | ICD-10-CM | POA: Diagnosis not present

## 2018-05-18 DIAGNOSIS — J0100 Acute maxillary sinusitis, unspecified: Secondary | ICD-10-CM | POA: Diagnosis not present

## 2018-05-18 DIAGNOSIS — R42 Dizziness and giddiness: Secondary | ICD-10-CM | POA: Diagnosis not present

## 2018-05-18 MED FILL — predniSONE 20 MG TABS: 20 | 5 days supply | Qty: 5 | Fill #0

## 2018-05-18 MED FILL — CEFDINIR 300 MG CAPSULE: 300 | 10 days supply | Qty: 10 | Fill #0

## 2018-05-28 DIAGNOSIS — H04123 Dry eye syndrome of bilateral lacrimal glands: Secondary | ICD-10-CM | POA: Diagnosis not present

## 2018-05-31 MED FILL — CLOBETASOL 0.05% SOLUTION: 0.05 | 20 days supply | Qty: 25 | Fill #1

## 2018-05-31 MED FILL — TRIAMCINOLONE ACETONIDE 55: 55 | 60 days supply | Qty: 17 | Fill #2

## 2018-07-05 ENCOUNTER — Ambulatory Visit: Payer: 59 | Admitting: Podiatry

## 2018-07-05 ENCOUNTER — Encounter: Payer: Self-pay | Admitting: Podiatry

## 2018-07-05 VITALS — BP 125/70

## 2018-07-05 DIAGNOSIS — B351 Tinea unguium: Secondary | ICD-10-CM

## 2018-07-05 DIAGNOSIS — G43109 Migraine with aura, not intractable, without status migrainosus: Secondary | ICD-10-CM | POA: Diagnosis not present

## 2018-07-05 MED FILL — SUMATRIPTAN SUCC 100 MG TAB: 100 | 30 days supply | Qty: 9 | Fill #1

## 2018-07-05 NOTE — Patient Instructions (Signed)

## 2018-07-05 NOTE — Progress Notes (Signed)
Subjective: Stephanie Schneider is a pleasant 53 year old African-American female presents today referred by Stephanie Jordan, MD with cc of dark-colored toenail located on the right great toe.  She states this has been present for years.  Onset was gradual.  Course is about the same.  There are no aggravating factors.  Past treatments include Penlac nail lacquer prescribed to her by her primary care physician.  She states that this topical did not work.  Stephanie Schneider also relates an episode in June high school at a dance where someone stepped on her right great toe  Past Medical History:  Diagnosis Date  . Fibroid 2011   asymptomatic  . HSV-2 infection      Patient Active Problem List   Diagnosis Date Noted  . 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 Bilateral wrist pain 09/04/2016  . Conductive hearing loss of left ear 08/18/2016  . Anxiety 12/19/2014  . Headache, migraine 12/19/2014  . Right hip pain 12/19/2014  . HSV-2 infection 09/10/2011  . Early menopause 09/10/2011  . ANSERINE BURSITIS 02/18/2008  . UNEQUAL LEG LENGTH 02/18/2008     History reviewed. No pertinent surgical history.    Current Outpatient Medications:  .  SUMAtriptan (IMITREX) 100 MG tablet, Take 50 mg by mouth as needed., Disp: , Rfl:  .  triamcinolone (NASACORT) 55 MCG/ACT AERO nasal inhaler, , Disp: , Rfl: 10 .  ALPRAZolam (XANAX) 0.25 MG tablet, Take 0.25 mg by mouth as needed., Disp: , Rfl:    No Known Allergies   Social History   Occupational History  . Not on file  Tobacco Use  . Smoking status: Never Smoker  . Smokeless tobacco: Never Used  Substance and Sexual Activity  . Alcohol use: No    Alcohol/week: 0.0 standard drinks  . Drug use: No  . Sexual activity: Yes    Partners: Male    Birth control/protection: None     Family History  Problem Relation Age of Onset  . Breast cancer Mother 46     There is no immunization history on file for this patient.    Review of systems: Positive Findings in bold print.  Constitutional:  chills, fatigue, fever, sweats, weight change Communication: Optometrist, sign Ecologist, hand writing, iPad/Android device Head: headaches, head injury Eyes: changes in vision, eye pain, glaucoma, cataracts, macular degeneration, diplopia, glare,  light sensitivity, eyeglasses or contacts, blindness Ears nose mouth throat: Hard of hearing, ringing in ears, deaf, sign language,  vertigo,   nosebleeds,  rhinitis,  cold sores, snoring, swollen glands Cardiovascular: HTN, edema, arrhythmia, pacemaker in place, defibrillator in place,  chest pain/tightness, chronic anticoagulation, blood clot, heart failure Peripheral Vascular: leg cramps, varicose veins, blood clots, lymphedema Respiratory:  difficulty breathing, denies congestion, SOB, wheezing, cough, emphysema Gastrointestinal: change in appetite or weight, abdominal pain, constipation, diarrhea, nausea, vomiting, vomiting blood, change in bowel habits, abdominal pain, jaundice, rectal bleeding, hemorrhoids, Genitourinary:  nocturia,  pain on urination,  blood in urine, Foley catheter, urinary urgency Musculoskeletal: uses mobility aid,  cramping, stiff joints, painful joints, decreased joint motion, fractures, OA, gout Skin: +changes in toenails, color change, dryness, itching, mole changes,  rash  Neurological: migraine headaches, numbness in feet, paresthesias in feet, burning in feet, fainting,  seizures, change in speech. denies headaches, memory problems/poor historian, cerebral palsy, weakness, paralysis Endocrine: diabetes, hypothyroidism, hyperthyroidism,  goiter, dry mouth, flushing, heat intolerance,  cold intolerance,  excessive thirst, denies polyuria,  nocturia Hematological:  easy bleeding,  excessive bleeding, easy bruising, enlarged lymph nodes, on long term blood thinner, history of past transusions Allergy/immunological:  hives, eczema, frequent  infections, multiple drug allergies, seasonal allergies, transplant recipient Psychiatric:  anxiety, depression, mood disorder, suicidal ideations, hallucinations   Objective: Vascular Examination: Capillary refill time immediate x 10 digits Dorsalis pedis and posterior tibial pulses 2/4 b/l Digital hair present x 10 digits Skin temperature gradient WNL b/l  Dermatological Examination: Skin with normal turgor, texture and tone b/l  Right great toenail noted to be slightly thickened.  There is a linear area of hyperpigmentation noted along the medial side of the toenail.  It appears to be an associated melanin pigmentation.  There is no erythema, no edema, no drainage noted.  Toenails 3.5 on the left foot and 4 and 5 on the right foot  Musculoskeletal: Muscle strength 5/5 to all LE muscle groups  Neurological: Sensation intact with 10 gram monofilament Vibratory sensation intact.  Assessment: 1. Painful onychomycosis    Plan: 1. Discussed onychomycosis and treatment options.  We discussed topical medications, temporary removal of the affected nails to grow back, oral medication and laser therapy.  2. She would like to know what her diagnosis is based on nail pathology.  Nail clippings were taken from the patient on today and sent to [facility] lab for fungal culture.  Stephanie Schneider was made aware that it takes 2 to 3weeks to get lab results back. 3. Patient to continue soft, supportive shoe gear 4. Patient to report any pedal injuries to medical professional immediately. 5. We will inform her when we receive her lab results.

## 2018-07-06 DIAGNOSIS — B351 Tinea unguium: Secondary | ICD-10-CM | POA: Diagnosis not present

## 2018-07-06 DIAGNOSIS — L603 Nail dystrophy: Secondary | ICD-10-CM | POA: Diagnosis not present

## 2018-07-06 DIAGNOSIS — L608 Other nail disorders: Secondary | ICD-10-CM | POA: Diagnosis not present

## 2018-07-14 ENCOUNTER — Telehealth: Payer: Self-pay | Admitting: *Deleted

## 2018-07-14 NOTE — Telephone Encounter (Signed)
-----   Message from Marzetta Board, DPM sent at 07/13/2018 12:08 PM EST ----- Regarding: Call with pt [place] Results Please let Stephanie Schneider know her nail biopsy was negative for fungus. Only showed trauma.  Thanks.

## 2018-07-14 NOTE — Telephone Encounter (Signed)
I called pt and informed of Dr. Heber Netcong review of results. Pt asked how to treat and I informed pt that if there is not fungus then treatment is to thin the nail, by using urea cream which helps soften the nail to allow for the area to be filed thinner and easier to trim. I told pt we have urea cream in the office.

## 2018-07-26 MED FILL — TRIAMCINOLONE ACETONIDE 55: 55 | 60 days supply | Qty: 17 | Fill #3

## 2018-08-02 DIAGNOSIS — Z1211 Encounter for screening for malignant neoplasm of colon: Secondary | ICD-10-CM | POA: Diagnosis not present

## 2018-09-08 MED FILL — CULTURELLE CAPSULE: 30 days supply | Qty: 30 | Fill #2

## 2018-09-13 MED FILL — VALACYCLOVIR HCL 500 MG TAB: 500 | 8 days supply | Qty: 30 | Fill #1

## 2018-09-13 MED FILL — CLOBETASOL 0.05% SOLUTION: 0.05 | 20 days supply | Qty: 25 | Fill #2

## 2018-09-13 MED FILL — TRIAMCINOLONE ACETONIDE 55: 55 | 60 days supply | Qty: 17 | Fill #4

## 2018-09-17 ENCOUNTER — Other Ambulatory Visit: Payer: Self-pay | Admitting: Obstetrics and Gynecology

## 2018-09-17 DIAGNOSIS — Z1231 Encounter for screening mammogram for malignant neoplasm of breast: Secondary | ICD-10-CM

## 2018-11-01 MED FILL — CLOBETASOL 0.05% SOLUTION: 0.05 | 20 days supply | Qty: 25 | Fill #3

## 2018-11-01 MED FILL — TRIAMCINOLONE ACETONIDE 55: 55 | 60 days supply | Qty: 17 | Fill #5

## 2018-11-01 MED FILL — CULTURELLE CAPSULE: 30 days supply | Qty: 30 | Fill #3

## 2018-11-09 ENCOUNTER — Ambulatory Visit: Payer: 59

## 2018-12-20 MED FILL — TRIAMCINOLONE ACETONIDE 55: 55 | 60 days supply | Qty: 17 | Fill #0

## 2018-12-27 MED FILL — SUMATRIPTAN SUCC 100 MG TAB: 100 | 30 days supply | Qty: 9 | Fill #0

## 2019-01-04 MED FILL — FINASTERIDE 5 MG TABLET: 5 | 30 days supply | Qty: 30 | Fill #0

## 2019-01-04 MED FILL — BACLOFEN 10 MG TABS: 10 | 10 days supply | Qty: 30 | Fill #0

## 2019-01-07 MED FILL — ELETRIPTAN HYDROBROMIDE 20: 20 | 9 days supply | Qty: 9 | Fill #0

## 2019-01-19 ENCOUNTER — Other Ambulatory Visit (HOSPITAL_COMMUNITY)
Admission: RE | Admit: 2019-01-19 | Discharge: 2019-01-19 | Disposition: A | Discharge status: Home / Self Care | Payer: 59 | Source: Ambulatory Visit | Attending: Obstetrics and Gynecology | Admitting: Obstetrics and Gynecology

## 2019-01-19 ENCOUNTER — Other Ambulatory Visit: Payer: Self-pay | Admitting: Obstetrics and Gynecology

## 2019-01-19 ENCOUNTER — Ambulatory Visit
Admission: RE | Admit: 2019-01-19 | Discharge: 2019-01-19 | Disposition: A | Discharge status: Home / Self Care | Payer: 59 | Source: Ambulatory Visit | Attending: Obstetrics and Gynecology | Admitting: Obstetrics and Gynecology

## 2019-01-19 DIAGNOSIS — Z124 Encounter for screening for malignant neoplasm of cervix: Secondary | ICD-10-CM | POA: Insufficient documentation

## 2019-01-19 DIAGNOSIS — Z1231 Encounter for screening mammogram for malignant neoplasm of breast: Secondary | ICD-10-CM

## 2019-01-21 MED FILL — VIT D2 1.25 MG (50,000 UNIT: 1.25 MG | 28 days supply | Qty: 4 | Fill #0

## 2019-01-22 LAB — CYTOLOGY - PAP
Diagnosis: NEGATIVE
HPV: NOT DETECTED

## 2019-01-26 ENCOUNTER — Encounter: Payer: Self-pay | Admitting: Registered"

## 2019-01-26 ENCOUNTER — Other Ambulatory Visit: Payer: Self-pay

## 2019-01-26 ENCOUNTER — Encounter: Payer: 59 | Attending: Family Medicine | Admitting: Registered"

## 2019-01-26 DIAGNOSIS — R7303 Prediabetes: Secondary | ICD-10-CM | POA: Diagnosis present

## 2019-01-26 NOTE — Progress Notes (Signed)
  Medical Nutrition Therapy:  Appt start time: 10:39 end time:  11:45.   Assessment:  Primary concerns today: Per referral, A1c was 6.0 on 12/2018.    Pt expectations: how to lower A1c and maintain normal A1c values  Pt states she will go back in 12 wks for lab values, 03/2019. Reports she has made changes since receiving lab results such as cutting back on sweets, eating out less for lunch, portioning meals to eat only half when eating out, having more vegetables with meals, eating less fried food, etc. Reports using InstaPot and airfryer to make chicken. Pt states she loves seafood. Reports some acid reflux concerns and states broccoli makes her gassy.   Pt also has goal to be a certain look that she references of her lying on the beach.   Preferred Learning Style:   No preference indicated   Learning Readiness:   Ready  Change in progress   MEDICATIONS: See list   DIETARY INTAKE:  Usual eating pattern includes 3 meals and 0 snacks per day.  Everyday foods include steak, salad, sweet potato (with brown sugar and butter), chicken (baked, grilled, fried), sometime popcorn, and fruit.  Avoided foods include [place].    24-hr recall:  B ( AM): cafeteria-special K + banana + whole milk or boiled egg + biscuit/potatoes + 2 slices of bacon  Snk ( AM): none  L ( PM): salad + homemade avocado dressing + salmon or [employer]-fried chicken + potato salad  Snk ( PM): none D ( PM): pizza + wings or spaghetti (with ground [place]) + garlic toast + salad (sometimes) Snk ( PM): none Beverages: water, whole milk, juice (more limited now),   Usual physical activity: walking 30-60 min 2x/week, Zumba 60 min sometimes  Estimated energy needs: 1600-1800 calories 180-200 g carbohydrates 120-135 g protein 44-50 g fat  Progress Towards Goal(s):  In progress.   Nutritional Diagnosis:  NB-1.1 Food and nutrition-related knowledge deficit As related to prediabtes.  As evidenced by pt verbalizes  incomplete knowledge.    Intervention:  Nutrition education and counseling. Pt was educated and counseled on prediabetes, eating to nourish the body, how carbohydrates work in our bodies, A1c values/ranges, My Plate for diabetes and the importance of physical activity. Pt was in agreement with goals listed.  Goals: - Trying to increase movement to at least 30 min, 3 days/week.  - Balance meals with carbohydrates, non-starchy vegetables, and protein. See handout.  - Listen to your body for satisfaction. Try not to suppress hunger.  - Balance snacks with carbohydrates and protein such as peanut butter crackers, cheese and crackers, string cheese and fruit, fruit and nuts, etc.  - Great job eating 3 meals/day!  Teaching Method Utilized:  Visual Auditory Hands on  Handouts given during visit include:  My Plate  Barriers to learning/adherence to lifestyle change: none  Demonstrated degree of understanding via:  Teach Back   Monitoring/Evaluation:  Dietary intake, exercise, and body weight prn.

## 2019-01-26 NOTE — Patient Instructions (Addendum)
-   Trying to increase movement to at least 30 min, 3 days/week.   - Balance meals with carbohydrates, non-starchy vegetables, and protein. See handout.   - Listen to your body for satisfaction. Try not to suppress hunger.   - Balance snacks with carbohydrates and protein such as peanut butter crackers, cheese and crackers, string cheese and fruit, fruit and nuts, etc.   - Great job eating 3 meals/day!

## 2019-02-04 MED FILL — CLOBETASOL 0.05% SOLUTION: 0.05 | 14 days supply | Qty: 25 | Fill #0

## 2019-03-03 MED FILL — TRIAMCINOLONE ACETONIDE 55: 55 | 60 days supply | Qty: 17 | Fill #1

## 2019-03-10 MED FILL — XIIDRA 5% EYE DROPS: 5 | 30 days supply | Qty: 60 | Fill #0

## 2019-03-11 MED FILL — CLOBETASOL 0.05% SOLUTION: 0.05 | 14 days supply | Qty: 25 | Fill #1

## 2019-04-06 ENCOUNTER — Other Ambulatory Visit: Payer: Self-pay | Admitting: Family Medicine

## 2019-04-06 DIAGNOSIS — N83209 Unspecified ovarian cyst, unspecified side: Secondary | ICD-10-CM

## 2019-04-06 DIAGNOSIS — R109 Unspecified abdominal pain: Secondary | ICD-10-CM

## 2019-04-13 ENCOUNTER — Ambulatory Visit
Admission: RE | Admit: 2019-04-13 | Discharge: 2019-04-13 | Disposition: A | Discharge status: Home / Self Care | Payer: 59 | Source: Ambulatory Visit | Attending: Family Medicine | Admitting: Family Medicine

## 2019-04-13 DIAGNOSIS — N83209 Unspecified ovarian cyst, unspecified side: Secondary | ICD-10-CM

## 2019-04-13 DIAGNOSIS — R109 Unspecified abdominal pain: Secondary | ICD-10-CM

## 2019-04-19 MED FILL — VIT D2 1.25 MG (50,000 UNIT: 1.25 MG | 28 days supply | Qty: 4 | Fill #0

## 2019-05-05 MED FILL — ELETRIPTAN HYDROBROMIDE 20: 20 | 9 days supply | Qty: 9 | Fill #1

## 2019-05-12 MED FILL — CLOBETASOL 0.05% SOLUTION: 0.05 | 14 days supply | Qty: 25 | Fill #2

## 2019-05-12 MED FILL — VIT D2 1.25 MG (50,000 UNIT: 1.25 MG | 28 days supply | Qty: 4 | Fill #1

## 2019-06-06 MED FILL — CULTURELLE CAPSULE: 30 days supply | Qty: 30 | Fill #0

## 2019-06-06 MED FILL — ELETRIPTAN HYDROBROMIDE 20: 20 | 15 days supply | Qty: 9 | Fill #0

## 2019-06-08 MED FILL — VIT D2 1.25 MG (50,000 UNIT: 1.25 MG | 28 days supply | Qty: 4 | Fill #2

## 2019-07-07 MED FILL — VIT D2 1.25 MG (50,000 UNIT: 1.25 MG | 28 days supply | Qty: 4 | Fill #1

## 2019-07-19 MED FILL — TRIAMCINOLONE ACETONIDE 55: 55 | 60 days supply | Qty: 17 | Fill #2

## 2019-07-22 MED FILL — ALPRAZolam 0.25 MG TABS: 0.25 | 30 days supply | Qty: 30 | Fill #0

## 2019-08-10 MED FILL — VIT D2 1.25 MG (50,000 UNIT: 1.25 MG | 28 days supply | Qty: 4 | Fill #2

## 2019-08-25 MED FILL — VALACYCLOVIR HCL 500 MG TAB: 500 | 8 days supply | Qty: 30 | Fill #0

## 2019-09-05 MED FILL — ALBUTEROL SULFATE HFA 108 (: 108 (90 BAS | 25 days supply | Qty: 18 | Fill #0

## 2019-09-05 MED FILL — FEXOFENADINE HCL 180 MG TAB: 180 | 30 days supply | Qty: 30 | Fill #0

## 2019-09-16 MED FILL — TRIAMCINOLONE ACETONIDE 55: 55 | 60 days supply | Qty: 17 | Fill #3

## 2019-09-19 MED FILL — ALPRAZolam 0.25 MG TABS: 0.25 | 30 days supply | Qty: 30 | Fill #0

## 2019-10-10 MED FILL — FEXOFENADINE HCL 180 MG TAB: 180 | 30 days supply | Qty: 30 | Fill #1

## 2019-11-08 MED FILL — ELETRIPTAN HYDROBROMIDE 20: 20 | 15 days supply | Qty: 9 | Fill #1

## 2019-12-08 MED FILL — TRIAMCINOLONE ACETONIDE 55: 55 | 30 days supply | Qty: 17 | Fill #0

## 2019-12-08 MED FILL — SM FEXOFENADINE 180 MG TAB: 180 MG | 30 days supply | Qty: 30 | Fill #2

## 2019-12-21 ENCOUNTER — Other Ambulatory Visit: Payer: Self-pay | Admitting: Obstetrics and Gynecology

## 2019-12-21 DIAGNOSIS — Z1231 Encounter for screening mammogram for malignant neoplasm of breast: Secondary | ICD-10-CM

## 2019-12-30 MED FILL — ALPRAZolam 0.25 MG TABS: 0.25 | 30 days supply | Qty: 30 | Fill #0

## 2020-01-23 ENCOUNTER — Ambulatory Visit
Admission: RE | Admit: 2020-01-23 | Discharge: 2020-01-23 | Disposition: A | Discharge status: Home / Self Care | Payer: 59 | Source: Ambulatory Visit | Attending: Obstetrics and Gynecology | Admitting: Obstetrics and Gynecology

## 2020-01-23 ENCOUNTER — Other Ambulatory Visit: Payer: Self-pay

## 2020-01-23 DIAGNOSIS — Z1231 Encounter for screening mammogram for malignant neoplasm of breast: Secondary | ICD-10-CM

## 2020-01-31 MED FILL — VALACYCLOVIR HCL 500 MG TAB: 500 | 8 days supply | Qty: 30 | Fill #0

## 2020-02-01 MED FILL — SM FEXOFENADINE 180 MG TAB: 180 MG | 30 days supply | Qty: 30 | Fill #3

## 2020-02-09 MED FILL — MELOXICAM 15 MG TABLET: 15 | 30 days supply | Qty: 30 | Fill #0

## 2020-03-29 ENCOUNTER — Other Ambulatory Visit (HOSPITAL_BASED_OUTPATIENT_CLINIC_OR_DEPARTMENT_OTHER): Payer: Self-pay | Admitting: Family Medicine

## 2020-03-29 MED FILL — ALPRAZolam 0.25 MG TABS: 0.25 | 30 days supply | Qty: 30 | Fill #0

## 2020-04-16 ENCOUNTER — Other Ambulatory Visit (HOSPITAL_BASED_OUTPATIENT_CLINIC_OR_DEPARTMENT_OTHER): Payer: Self-pay | Admitting: Family Medicine

## 2020-04-16 MED FILL — ELETRIPTAN HYDROBROMIDE 20: 20 | 14 days supply | Qty: 9 | Fill #0

## 2020-04-20 ENCOUNTER — Other Ambulatory Visit (HOSPITAL_BASED_OUTPATIENT_CLINIC_OR_DEPARTMENT_OTHER): Payer: Self-pay | Admitting: Gastroenterology

## 2020-04-20 MED FILL — PEG-3350 SOLUTION: 420 | 1 days supply | Qty: 4000 | Fill #0

## 2020-04-20 MED FILL — SM GENTLE LAXATIVE EC 5 MG: 5 MG | 100 days supply | Qty: 100 | Fill #0

## 2020-06-08 ENCOUNTER — Other Ambulatory Visit (HOSPITAL_BASED_OUTPATIENT_CLINIC_OR_DEPARTMENT_OTHER): Payer: Self-pay | Admitting: Internal Medicine

## 2020-06-08 ENCOUNTER — Ambulatory Visit: Payer: 59 | Attending: Internal Medicine

## 2020-06-08 DIAGNOSIS — Z23 Encounter for immunization: Secondary | ICD-10-CM

## 2020-06-08 NOTE — Progress Notes (Signed)
° °  Covid-19 Vaccination Clinic  Name:  Stephanie Schneider    MRN: [id number] DOB: 03-01-1966  06/08/2020  [name] was observed post Covid-19 immunization for 15 minutes without incident. She was provided with Vaccine Information Sheet and instruction to access the V-Safe system.   [name] was instructed to call 911 with any severe reactions post vaccine:  Difficulty breathing   Swelling of face and throat   A fast heartbeat   A bad rash all over body   Dizziness and weakness   Immunizations Administered    Name Date Dose VIS Date Route   Pfizer COVID-19 Vaccine 06/08/2020 11:12 AM 0.3 mL 04/11/2020 Intramuscular   Manufacturer: [employer]   Lot: [id number]   [place]: [id number]

## 2020-06-11 MED FILL — PFIZER-BIONTECH COVID-19 VA: 30 | 1 days supply | Qty: 0 | Fill #0

## 2020-06-14 ENCOUNTER — Other Ambulatory Visit (HOSPITAL_BASED_OUTPATIENT_CLINIC_OR_DEPARTMENT_OTHER): Payer: Self-pay | Admitting: Optometry

## 2020-06-14 MED FILL — CEPHALEXIN 500 MG CAPSULE: 500 | 10 days supply | Qty: 20 | Fill #0

## 2020-06-20 MED FILL — ELETRIPTAN HYDROBROMIDE 20: 20 | 14 days supply | Qty: 9 | Fill #1

## 2020-06-20 MED FILL — SM FEXOFENADINE 180 MG TAB: 180 MG | 30 days supply | Qty: 30 | Fill #4

## 2020-07-12 ENCOUNTER — Other Ambulatory Visit (HOSPITAL_BASED_OUTPATIENT_CLINIC_OR_DEPARTMENT_OTHER): Payer: Self-pay | Admitting: Dermatology

## 2020-07-12 DIAGNOSIS — D225 Melanocytic nevi of trunk: Secondary | ICD-10-CM | POA: Insufficient documentation

## 2020-07-12 MED FILL — FINASTERIDE 5 MG TABLET: 5 | 30 days supply | Qty: 30 | Fill #0

## 2020-07-12 MED FILL — CLOBETASOL PROPIONATE 0.05: 0.05 | 30 days supply | Qty: 50 | Fill #0

## 2020-07-19 ENCOUNTER — Other Ambulatory Visit (HOSPITAL_BASED_OUTPATIENT_CLINIC_OR_DEPARTMENT_OTHER): Payer: Self-pay | Admitting: Family Medicine

## 2020-07-20 MED FILL — ETODOLAC 400 MG TABS: 400 | 10 days supply | Qty: 20 | Fill #0

## 2020-07-20 MED FILL — BACLOFEN 10 MG TABS: 10 | 10 days supply | Qty: 30 | Fill #0

## 2020-07-23 ENCOUNTER — Other Ambulatory Visit (HOSPITAL_BASED_OUTPATIENT_CLINIC_OR_DEPARTMENT_OTHER): Payer: Self-pay | Admitting: Family Medicine

## 2020-07-23 MED FILL — CULTURELLE CAPSULE: 30 days supply | Qty: 30 | Fill #0

## 2020-07-27 ENCOUNTER — Other Ambulatory Visit (HOSPITAL_BASED_OUTPATIENT_CLINIC_OR_DEPARTMENT_OTHER): Payer: Self-pay | Admitting: Family Medicine

## 2020-07-27 MED FILL — ALPRAZolam 0.25 MG TABS: 0.25 | 30 days supply | Qty: 30 | Fill #0

## 2020-07-30 ENCOUNTER — Other Ambulatory Visit (HOSPITAL_BASED_OUTPATIENT_CLINIC_OR_DEPARTMENT_OTHER): Payer: Self-pay | Admitting: Family Medicine

## 2020-07-30 MED FILL — AZITHROMYCIN 250 MG TABLET: 250 | 5 days supply | Qty: 6 | Fill #0

## 2020-08-16 ENCOUNTER — Other Ambulatory Visit (HOSPITAL_BASED_OUTPATIENT_CLINIC_OR_DEPARTMENT_OTHER): Payer: Self-pay | Admitting: Obstetrics and Gynecology

## 2020-08-16 MED FILL — VALACYCLOVIR HCL 500 MG TAB: 500 | 30 days supply | Qty: 30 | Fill #0

## 2020-08-22 MED FILL — ELETRIPTAN HYDROBROMIDE 20: 20 | 3 days supply | Qty: 9 | Fill #2

## 2020-08-29 ENCOUNTER — Other Ambulatory Visit (HOSPITAL_BASED_OUTPATIENT_CLINIC_OR_DEPARTMENT_OTHER): Payer: Self-pay | Admitting: Family Medicine

## 2020-08-29 MED FILL — AZITHROMYCIN 250 MG TABLET: 250 | 5 days supply | Qty: 6 | Fill #0

## 2020-09-14 ENCOUNTER — Other Ambulatory Visit (HOSPITAL_BASED_OUTPATIENT_CLINIC_OR_DEPARTMENT_OTHER): Payer: Self-pay | Admitting: Family Medicine

## 2020-09-14 MED FILL — ALBUTEROL SULFATE HFA 108 (: 108 (90 BAS | 17 days supply | Qty: 18 | Fill #0

## 2020-09-17 ENCOUNTER — Other Ambulatory Visit (HOSPITAL_BASED_OUTPATIENT_CLINIC_OR_DEPARTMENT_OTHER): Payer: Self-pay | Admitting: Family Medicine

## 2020-09-17 MED FILL — ELETRIPTAN HYDROBROMIDE 20: 20 | 3 days supply | Qty: 9 | Fill #3

## 2020-11-06 ENCOUNTER — Other Ambulatory Visit (HOSPITAL_COMMUNITY): Payer: Self-pay

## 2020-11-06 MED FILL — Eletriptan Hydrobromide Tab 20 MG (Base Equivalent): ORAL | 30 days supply | Qty: 9 | Fill #0 | Status: AC

## 2020-11-07 ENCOUNTER — Other Ambulatory Visit (HOSPITAL_BASED_OUTPATIENT_CLINIC_OR_DEPARTMENT_OTHER): Payer: Self-pay

## 2020-11-07 ENCOUNTER — Other Ambulatory Visit (HOSPITAL_COMMUNITY): Payer: Self-pay

## 2020-11-07 MED ORDER — PANTOPRAZOLE SODIUM 40 MG PO TBEC
DELAYED_RELEASE_TABLET | ORAL | 1 refills | Status: AC
  Filled 2020-11-07: qty 30, 30d supply, fill #0
  Filled 2020-12-03: qty 30, 30d supply, fill #1

## 2020-11-08 ENCOUNTER — Other Ambulatory Visit (HOSPITAL_COMMUNITY): Payer: Self-pay

## 2020-11-13 ENCOUNTER — Other Ambulatory Visit: Payer: Self-pay | Admitting: Obstetrics and Gynecology

## 2020-11-13 ENCOUNTER — Other Ambulatory Visit (HOSPITAL_COMMUNITY): Payer: Self-pay

## 2020-11-13 DIAGNOSIS — Z1231 Encounter for screening mammogram for malignant neoplasm of breast: Secondary | ICD-10-CM

## 2020-12-03 ENCOUNTER — Other Ambulatory Visit (HOSPITAL_BASED_OUTPATIENT_CLINIC_OR_DEPARTMENT_OTHER): Payer: Self-pay

## 2020-12-05 ENCOUNTER — Other Ambulatory Visit (HOSPITAL_BASED_OUTPATIENT_CLINIC_OR_DEPARTMENT_OTHER): Payer: Self-pay

## 2020-12-05 MED ORDER — CARESTART COVID-19 HOME TEST VI KIT
PACK | 0 refills | Status: AC
  Filled 2020-12-05: qty 2, 4d supply, fill #0

## 2020-12-19 ENCOUNTER — Other Ambulatory Visit (HOSPITAL_COMMUNITY): Payer: Self-pay

## 2020-12-19 MED FILL — Albuterol Sulfate Inhal Aero 108 MCG/ACT (90MCG Base Equiv): RESPIRATORY_TRACT | 17 days supply | Qty: 8.5 | Fill #0 | Status: AC

## 2021-01-28 ENCOUNTER — Ambulatory Visit
Admission: RE | Admit: 2021-01-28 | Discharge: 2021-01-28 | Disposition: A | Discharge status: Home / Self Care | Payer: 59 | Source: Ambulatory Visit | Attending: Obstetrics and Gynecology | Admitting: Obstetrics and Gynecology

## 2021-01-28 ENCOUNTER — Other Ambulatory Visit: Payer: Self-pay

## 2021-01-28 DIAGNOSIS — Z1231 Encounter for screening mammogram for malignant neoplasm of breast: Secondary | ICD-10-CM

## 2021-02-05 ENCOUNTER — Other Ambulatory Visit (HOSPITAL_BASED_OUTPATIENT_CLINIC_OR_DEPARTMENT_OTHER): Payer: Self-pay

## 2021-02-06 ENCOUNTER — Other Ambulatory Visit (HOSPITAL_BASED_OUTPATIENT_CLINIC_OR_DEPARTMENT_OTHER): Payer: Self-pay

## 2021-02-06 MED ORDER — PROMETHAZINE-DM 6.25-15 MG/5ML PO SYRP
ORAL_SOLUTION | ORAL | 0 refills | Status: AC
  Filled 2021-02-06: qty 120, 7d supply, fill #0

## 2021-02-06 MED ORDER — BENZONATATE 100 MG PO CAPS
ORAL_CAPSULE | ORAL | 0 refills | Status: AC
  Filled 2021-02-06: qty 40, 7d supply, fill #0

## 2021-02-13 MED FILL — Eletriptan Hydrobromide Tab 20 MG (Base Equivalent): ORAL | 30 days supply | Qty: 9 | Fill #1 | Status: AC

## 2021-02-13 MED FILL — Lactobacillus Rhamnosus (GG) Cap: ORAL | 30 days supply | Qty: 30 | Fill #0 | Status: AC

## 2021-02-14 ENCOUNTER — Other Ambulatory Visit (HOSPITAL_BASED_OUTPATIENT_CLINIC_OR_DEPARTMENT_OTHER): Payer: Self-pay

## 2021-02-15 ENCOUNTER — Other Ambulatory Visit (HOSPITAL_BASED_OUTPATIENT_CLINIC_OR_DEPARTMENT_OTHER): Payer: Self-pay

## 2021-02-19 ENCOUNTER — Other Ambulatory Visit (HOSPITAL_BASED_OUTPATIENT_CLINIC_OR_DEPARTMENT_OTHER): Payer: Self-pay

## 2021-02-19 MED ORDER — METHYLPREDNISOLONE 4 MG PO TBPK
ORAL_TABLET | ORAL | 0 refills | Status: DC
  Filled 2021-02-19: qty 21, 6d supply, fill #0

## 2021-02-21 ENCOUNTER — Other Ambulatory Visit (HOSPITAL_BASED_OUTPATIENT_CLINIC_OR_DEPARTMENT_OTHER): Payer: Self-pay

## 2021-02-21 MED ORDER — AZITHROMYCIN 250 MG PO TABS
ORAL_TABLET | ORAL | 0 refills | Status: DC
  Filled 2021-02-21: qty 6, 5d supply, fill #0

## 2021-02-28 ENCOUNTER — Other Ambulatory Visit (HOSPITAL_COMMUNITY): Payer: Self-pay

## 2021-02-28 ENCOUNTER — Other Ambulatory Visit (HOSPITAL_BASED_OUTPATIENT_CLINIC_OR_DEPARTMENT_OTHER): Payer: Self-pay

## 2021-03-01 ENCOUNTER — Other Ambulatory Visit (HOSPITAL_BASED_OUTPATIENT_CLINIC_OR_DEPARTMENT_OTHER): Payer: Self-pay

## 2021-03-01 ENCOUNTER — Other Ambulatory Visit (HOSPITAL_COMMUNITY): Payer: Self-pay

## 2021-03-01 MED ORDER — ALPRAZOLAM 0.25 MG PO TABS
ORAL_TABLET | ORAL | 0 refills | Status: AC
  Filled 2021-03-01: qty 30, 30d supply, fill #0

## 2021-03-22 ENCOUNTER — Other Ambulatory Visit (HOSPITAL_BASED_OUTPATIENT_CLINIC_OR_DEPARTMENT_OTHER): Payer: Self-pay

## 2021-03-22 MED ORDER — ERGOCALCIFEROL 1.25 MG (50000 UT) PO CAPS
ORAL_CAPSULE | ORAL | 3 refills | Status: AC
  Filled 2021-03-22: qty 4, 28d supply, fill #0
  Filled 2021-04-11 – 2021-04-12 (×2): qty 4, 28d supply, fill #1
  Filled 2021-05-11: qty 4, 28d supply, fill #2
  Filled 2021-06-05: qty 4, 28d supply, fill #3
  Filled 2021-07-06: qty 4, 28d supply, fill #4
  Filled 2021-08-04: qty 4, 28d supply, fill #5
  Filled 2021-09-01: qty 4, 28d supply, fill #6
  Filled 2021-09-30: qty 4, 28d supply, fill #7
  Filled 2021-10-26: qty 4, 28d supply, fill #8
  Filled 2021-11-26 – 2021-11-28 (×2): qty 4, 28d supply, fill #9
  Filled 2021-12-21: qty 4, 28d supply, fill #10
  Filled 2021-12-30: qty 4, 28d supply, fill #0
  Filled 2022-01-22: qty 4, 28d supply, fill #1

## 2021-04-04 ENCOUNTER — Other Ambulatory Visit (HOSPITAL_BASED_OUTPATIENT_CLINIC_OR_DEPARTMENT_OTHER): Payer: Self-pay

## 2021-04-04 MED ORDER — ELETRIPTAN HYDROBROMIDE 20 MG PO TABS
ORAL_TABLET | ORAL | 5 refills | Status: DC
  Filled 2021-04-04: qty 9, 15d supply, fill #0

## 2021-04-05 ENCOUNTER — Other Ambulatory Visit (HOSPITAL_BASED_OUTPATIENT_CLINIC_OR_DEPARTMENT_OTHER): Payer: Self-pay

## 2021-04-11 ENCOUNTER — Other Ambulatory Visit (HOSPITAL_BASED_OUTPATIENT_CLINIC_OR_DEPARTMENT_OTHER): Payer: Self-pay

## 2021-04-12 ENCOUNTER — Other Ambulatory Visit (HOSPITAL_COMMUNITY): Payer: Self-pay

## 2021-04-12 ENCOUNTER — Other Ambulatory Visit (HOSPITAL_BASED_OUTPATIENT_CLINIC_OR_DEPARTMENT_OTHER): Payer: Self-pay

## 2021-04-26 ENCOUNTER — Other Ambulatory Visit (HOSPITAL_BASED_OUTPATIENT_CLINIC_OR_DEPARTMENT_OTHER): Payer: Self-pay

## 2021-04-26 MED ORDER — TRIAMCINOLONE ACETONIDE 0.1 % EX CREA
TOPICAL_CREAM | CUTANEOUS | 0 refills | Status: AC
  Filled 2021-04-26: qty 30, 14d supply, fill #0

## 2021-05-13 ENCOUNTER — Other Ambulatory Visit (HOSPITAL_BASED_OUTPATIENT_CLINIC_OR_DEPARTMENT_OTHER): Payer: Self-pay

## 2021-05-22 ENCOUNTER — Other Ambulatory Visit (HOSPITAL_BASED_OUTPATIENT_CLINIC_OR_DEPARTMENT_OTHER): Payer: Self-pay

## 2021-05-22 MED ORDER — PREDNISONE 20 MG PO TABS
ORAL_TABLET | ORAL | 0 refills | Status: DC
  Filled 2021-05-22: qty 10, 5d supply, fill #0

## 2021-05-22 MED ORDER — AZITHROMYCIN 250 MG PO TABS
ORAL_TABLET | ORAL | 0 refills | Status: DC
  Filled 2021-05-22: qty 6, 5d supply, fill #0

## 2021-06-05 ENCOUNTER — Other Ambulatory Visit (HOSPITAL_BASED_OUTPATIENT_CLINIC_OR_DEPARTMENT_OTHER): Payer: Self-pay

## 2021-06-06 ENCOUNTER — Other Ambulatory Visit (HOSPITAL_BASED_OUTPATIENT_CLINIC_OR_DEPARTMENT_OTHER): Payer: Self-pay

## 2021-06-07 ENCOUNTER — Other Ambulatory Visit (HOSPITAL_BASED_OUTPATIENT_CLINIC_OR_DEPARTMENT_OTHER): Payer: Self-pay

## 2021-06-07 MED ORDER — NURTEC 75 MG PO TBDP
ORAL_TABLET | ORAL | 5 refills | Status: AC
  Filled 2021-06-10: qty 8, 8d supply, fill #0
  Filled 2021-07-30: qty 8, 8d supply, fill #1
  Filled 2021-08-19: qty 8, 8d supply, fill #2
  Filled 2021-11-26 – 2021-11-28 (×2): qty 8, 8d supply, fill #3
  Filled 2022-02-19: qty 8, 8d supply, fill #4
  Filled 2022-05-06: qty 8, 8d supply, fill #5

## 2021-06-10 ENCOUNTER — Other Ambulatory Visit (HOSPITAL_BASED_OUTPATIENT_CLINIC_OR_DEPARTMENT_OTHER): Payer: Self-pay

## 2021-06-18 ENCOUNTER — Other Ambulatory Visit (HOSPITAL_BASED_OUTPATIENT_CLINIC_OR_DEPARTMENT_OTHER): Payer: Self-pay

## 2021-07-08 ENCOUNTER — Other Ambulatory Visit (HOSPITAL_BASED_OUTPATIENT_CLINIC_OR_DEPARTMENT_OTHER): Payer: Self-pay

## 2021-07-09 ENCOUNTER — Other Ambulatory Visit (HOSPITAL_BASED_OUTPATIENT_CLINIC_OR_DEPARTMENT_OTHER): Payer: Self-pay

## 2021-07-09 MED FILL — Valacyclovir HCl Tab 500 MG: ORAL | 8 days supply | Qty: 30 | Fill #0 | Status: AC

## 2021-07-25 ENCOUNTER — Other Ambulatory Visit (HOSPITAL_BASED_OUTPATIENT_CLINIC_OR_DEPARTMENT_OTHER): Payer: Self-pay

## 2021-07-25 MED ORDER — CLOBETASOL PROPIONATE 0.05 % EX SOLN
CUTANEOUS | 5 refills | Status: DC
  Filled 2021-07-25: qty 50, 30d supply, fill #0
  Filled 2022-07-06: qty 50, 30d supply, fill #1

## 2021-07-30 ENCOUNTER — Other Ambulatory Visit (HOSPITAL_BASED_OUTPATIENT_CLINIC_OR_DEPARTMENT_OTHER): Payer: Self-pay

## 2021-08-05 ENCOUNTER — Other Ambulatory Visit (HOSPITAL_BASED_OUTPATIENT_CLINIC_OR_DEPARTMENT_OTHER): Payer: Self-pay

## 2021-08-09 ENCOUNTER — Other Ambulatory Visit (HOSPITAL_COMMUNITY): Payer: Self-pay

## 2021-08-09 MED ORDER — PREDNISONE 10 MG (21) PO TBPK
ORAL_TABLET | ORAL | 0 refills | Status: DC
  Filled 2021-08-09: qty 21, 6d supply, fill #0

## 2021-08-19 ENCOUNTER — Other Ambulatory Visit (HOSPITAL_COMMUNITY): Payer: Self-pay

## 2021-09-02 ENCOUNTER — Other Ambulatory Visit (HOSPITAL_BASED_OUTPATIENT_CLINIC_OR_DEPARTMENT_OTHER): Payer: Self-pay

## 2021-09-09 ENCOUNTER — Other Ambulatory Visit (HOSPITAL_BASED_OUTPATIENT_CLINIC_OR_DEPARTMENT_OTHER): Payer: Self-pay

## 2021-09-09 MED ORDER — AZITHROMYCIN 250 MG PO TABS
ORAL_TABLET | ORAL | 0 refills | Status: DC
  Filled 2021-09-09: qty 6, 5d supply, fill #0

## 2021-09-30 ENCOUNTER — Other Ambulatory Visit (HOSPITAL_BASED_OUTPATIENT_CLINIC_OR_DEPARTMENT_OTHER): Payer: Self-pay

## 2021-10-03 ENCOUNTER — Other Ambulatory Visit (HOSPITAL_BASED_OUTPATIENT_CLINIC_OR_DEPARTMENT_OTHER): Payer: Self-pay

## 2021-10-22 ENCOUNTER — Other Ambulatory Visit (HOSPITAL_COMMUNITY): Payer: Self-pay

## 2021-10-23 ENCOUNTER — Other Ambulatory Visit (HOSPITAL_COMMUNITY): Payer: Self-pay

## 2021-10-24 ENCOUNTER — Other Ambulatory Visit (HOSPITAL_COMMUNITY): Payer: Self-pay

## 2021-10-24 MED ORDER — ALPRAZOLAM 0.25 MG PO TABS
ORAL_TABLET | ORAL | 0 refills | Status: DC
  Filled 2021-10-24: qty 30, 30d supply, fill #0

## 2021-10-28 ENCOUNTER — Other Ambulatory Visit (HOSPITAL_BASED_OUTPATIENT_CLINIC_OR_DEPARTMENT_OTHER): Payer: Self-pay

## 2021-11-07 ENCOUNTER — Other Ambulatory Visit (HOSPITAL_BASED_OUTPATIENT_CLINIC_OR_DEPARTMENT_OTHER): Payer: Self-pay

## 2021-11-07 ENCOUNTER — Other Ambulatory Visit (HOSPITAL_COMMUNITY): Payer: Self-pay

## 2021-11-07 MED ORDER — CLINDAMYCIN PHOSPHATE 1 % EX SOLN
CUTANEOUS | 5 refills | Status: AC
  Filled 2021-11-07: qty 60, 30d supply, fill #0
  Filled 2021-11-07: qty 60, 20d supply, fill #0

## 2021-11-14 ENCOUNTER — Other Ambulatory Visit (HOSPITAL_BASED_OUTPATIENT_CLINIC_OR_DEPARTMENT_OTHER): Payer: Self-pay

## 2021-11-14 MED ORDER — AZITHROMYCIN 250 MG PO TABS
ORAL_TABLET | ORAL | 0 refills | Status: DC
  Filled 2021-11-14: qty 6, 5d supply, fill #0

## 2021-11-26 ENCOUNTER — Other Ambulatory Visit (HOSPITAL_BASED_OUTPATIENT_CLINIC_OR_DEPARTMENT_OTHER): Payer: Self-pay

## 2021-11-28 ENCOUNTER — Other Ambulatory Visit (HOSPITAL_BASED_OUTPATIENT_CLINIC_OR_DEPARTMENT_OTHER): Payer: Self-pay

## 2021-11-28 ENCOUNTER — Other Ambulatory Visit (HOSPITAL_COMMUNITY): Payer: Self-pay

## 2021-12-11 ENCOUNTER — Other Ambulatory Visit (HOSPITAL_BASED_OUTPATIENT_CLINIC_OR_DEPARTMENT_OTHER): Payer: Self-pay

## 2021-12-12 ENCOUNTER — Other Ambulatory Visit (HOSPITAL_BASED_OUTPATIENT_CLINIC_OR_DEPARTMENT_OTHER): Payer: Self-pay

## 2021-12-12 ENCOUNTER — Other Ambulatory Visit (HOSPITAL_COMMUNITY): Payer: Self-pay

## 2021-12-12 MED ORDER — PREDNISONE 10 MG PO TABS
ORAL_TABLET | ORAL | 0 refills | Status: DC
  Filled 2021-12-12 (×2): qty 21, 6d supply, fill #0

## 2021-12-16 ENCOUNTER — Other Ambulatory Visit: Payer: Self-pay | Admitting: Obstetrics and Gynecology

## 2021-12-16 DIAGNOSIS — Z1231 Encounter for screening mammogram for malignant neoplasm of breast: Secondary | ICD-10-CM

## 2021-12-17 ENCOUNTER — Other Ambulatory Visit (HOSPITAL_BASED_OUTPATIENT_CLINIC_OR_DEPARTMENT_OTHER): Payer: Self-pay

## 2021-12-18 ENCOUNTER — Other Ambulatory Visit (HOSPITAL_BASED_OUTPATIENT_CLINIC_OR_DEPARTMENT_OTHER): Payer: Self-pay

## 2021-12-18 MED ORDER — VALACYCLOVIR HCL 500 MG PO TABS
ORAL_TABLET | ORAL | 3 refills | Status: AC
  Filled 2021-12-18: qty 30, 30d supply, fill #0
  Filled 2022-08-07: qty 30, 30d supply, fill #1

## 2021-12-23 ENCOUNTER — Other Ambulatory Visit (HOSPITAL_BASED_OUTPATIENT_CLINIC_OR_DEPARTMENT_OTHER): Payer: Self-pay

## 2021-12-23 ENCOUNTER — Other Ambulatory Visit (HOSPITAL_COMMUNITY): Payer: Self-pay

## 2021-12-26 ENCOUNTER — Other Ambulatory Visit (HOSPITAL_BASED_OUTPATIENT_CLINIC_OR_DEPARTMENT_OTHER): Payer: Self-pay

## 2021-12-30 ENCOUNTER — Other Ambulatory Visit (HOSPITAL_BASED_OUTPATIENT_CLINIC_OR_DEPARTMENT_OTHER): Payer: Self-pay

## 2021-12-30 ENCOUNTER — Other Ambulatory Visit (HOSPITAL_COMMUNITY): Payer: Self-pay

## 2022-01-22 ENCOUNTER — Other Ambulatory Visit (HOSPITAL_BASED_OUTPATIENT_CLINIC_OR_DEPARTMENT_OTHER): Payer: Self-pay

## 2022-01-29 ENCOUNTER — Ambulatory Visit
Admission: RE | Admit: 2022-01-29 | Discharge: 2022-01-29 | Disposition: A | Discharge status: Home / Self Care | Payer: 59 | Source: Ambulatory Visit | Attending: Obstetrics and Gynecology | Admitting: Obstetrics and Gynecology

## 2022-01-29 DIAGNOSIS — Z1231 Encounter for screening mammogram for malignant neoplasm of breast: Secondary | ICD-10-CM

## 2022-02-20 ENCOUNTER — Other Ambulatory Visit (HOSPITAL_BASED_OUTPATIENT_CLINIC_OR_DEPARTMENT_OTHER): Payer: Self-pay

## 2022-02-20 ENCOUNTER — Other Ambulatory Visit (HOSPITAL_COMMUNITY): Payer: Self-pay

## 2022-02-25 ENCOUNTER — Other Ambulatory Visit (HOSPITAL_COMMUNITY): Payer: Self-pay

## 2022-02-25 ENCOUNTER — Other Ambulatory Visit (HOSPITAL_BASED_OUTPATIENT_CLINIC_OR_DEPARTMENT_OTHER): Payer: Self-pay

## 2022-02-27 ENCOUNTER — Other Ambulatory Visit (HOSPITAL_COMMUNITY): Payer: Self-pay

## 2022-03-06 ENCOUNTER — Other Ambulatory Visit (HOSPITAL_BASED_OUTPATIENT_CLINIC_OR_DEPARTMENT_OTHER): Payer: Self-pay

## 2022-03-07 ENCOUNTER — Other Ambulatory Visit (HOSPITAL_BASED_OUTPATIENT_CLINIC_OR_DEPARTMENT_OTHER): Payer: Self-pay

## 2022-03-10 ENCOUNTER — Other Ambulatory Visit (HOSPITAL_BASED_OUTPATIENT_CLINIC_OR_DEPARTMENT_OTHER): Payer: Self-pay

## 2022-03-10 MED ORDER — ALPRAZOLAM 0.25 MG PO TABS
0.1250 mg | ORAL_TABLET | Freq: Every day | ORAL | 0 refills | Status: AC | PRN
  Filled 2022-03-10: qty 30, 30d supply, fill #0

## 2022-05-06 ENCOUNTER — Other Ambulatory Visit (HOSPITAL_BASED_OUTPATIENT_CLINIC_OR_DEPARTMENT_OTHER): Payer: Self-pay

## 2022-05-20 ENCOUNTER — Other Ambulatory Visit (HOSPITAL_BASED_OUTPATIENT_CLINIC_OR_DEPARTMENT_OTHER): Payer: Self-pay

## 2022-06-09 IMAGING — MG MM DIGITAL SCREENING BILAT W/ TOMO AND CAD
8 series · 9 of 24 positions shown · non-contrast
Comparison: Previous exam(s).

CLINICAL DATA: Screening.

EXAM:
DIGITAL SCREENING BILATERAL MAMMOGRAM WITH TOMOSYNTHESIS AND CAD
TECHNIQUE: Bilateral screening digital craniocaudal and mediolateral oblique
mammograms were obtained. Bilateral screening digital breast
tomosynthesis was performed. The images were evaluated with
computer-aided detection.

[R CC synth-2D]
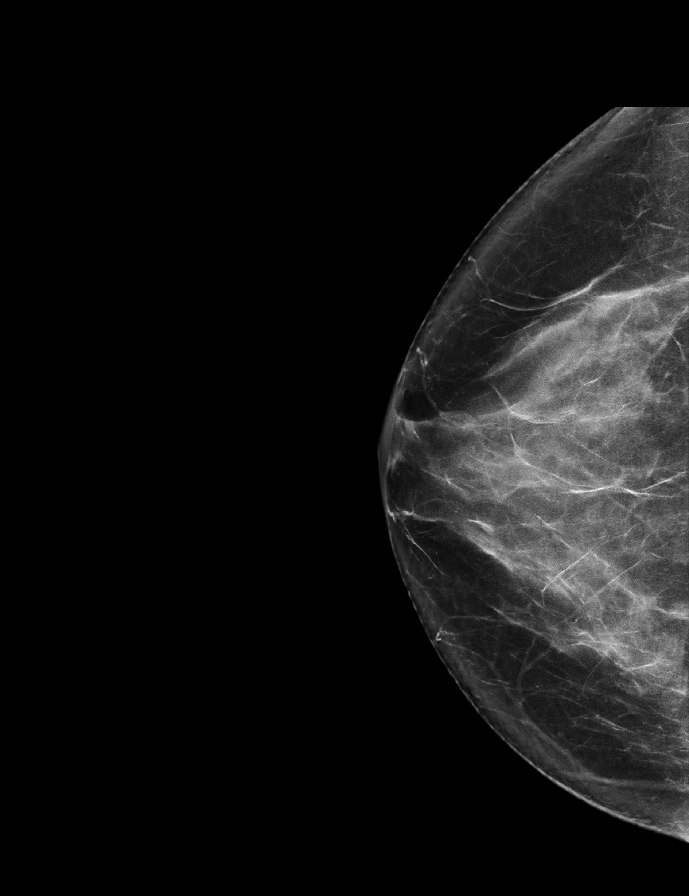

[L MLO synth-2D]
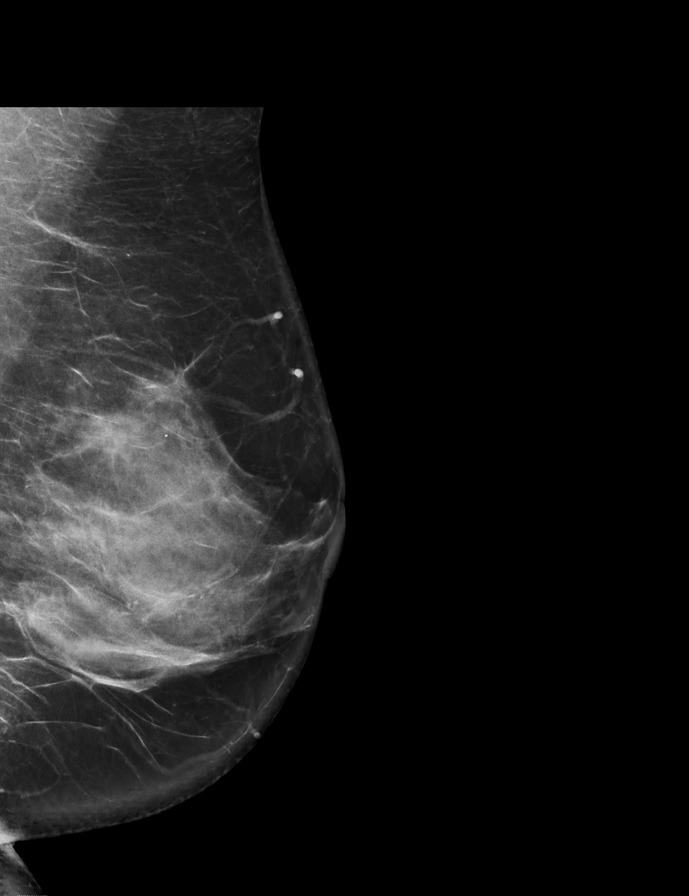

[L CC synth-2D]
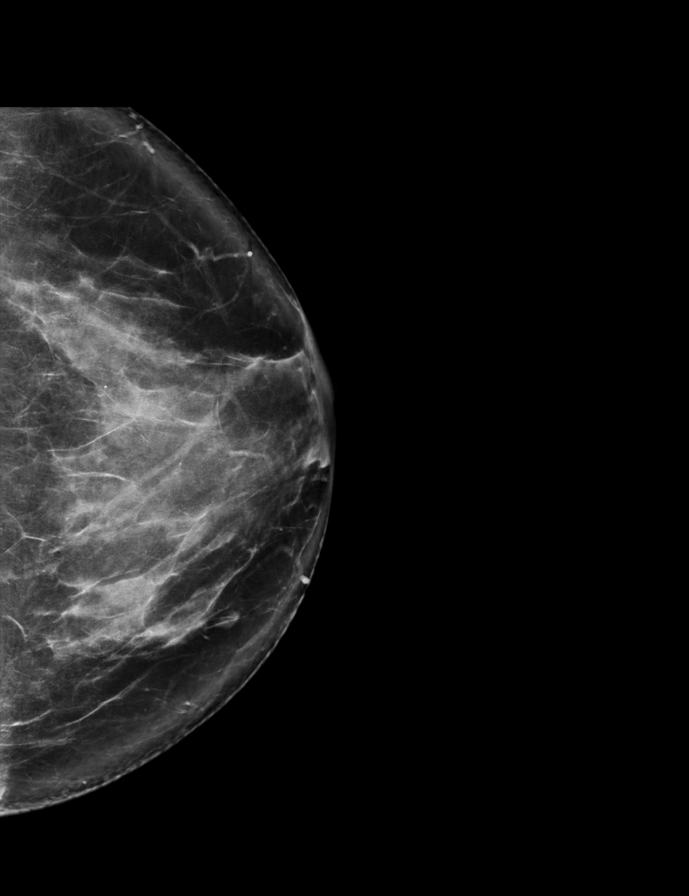

[R MLO synth-2D]
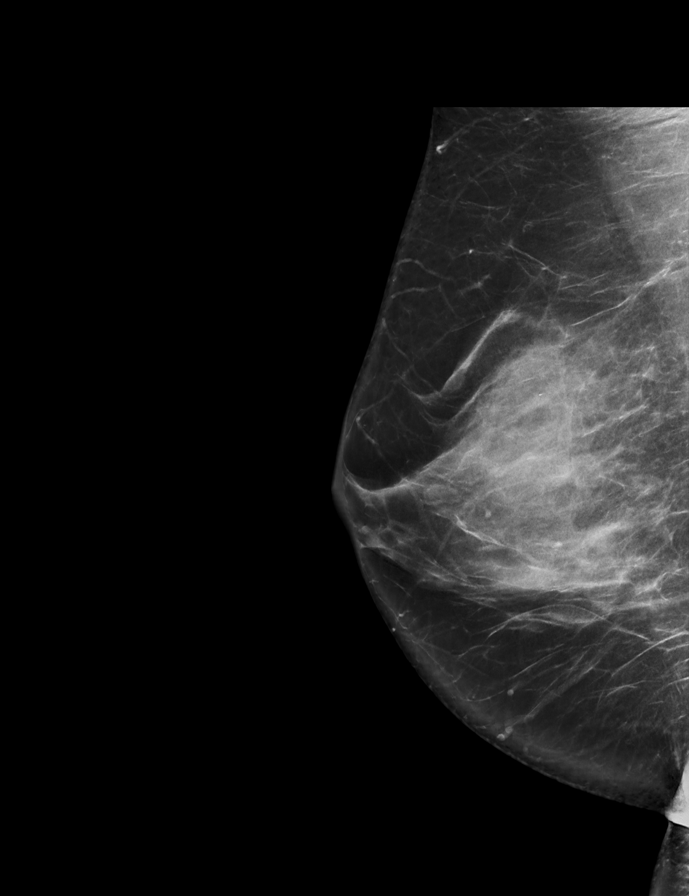

[L MLO tomo · 2 of 86 frames shown]
[frame 28/86]
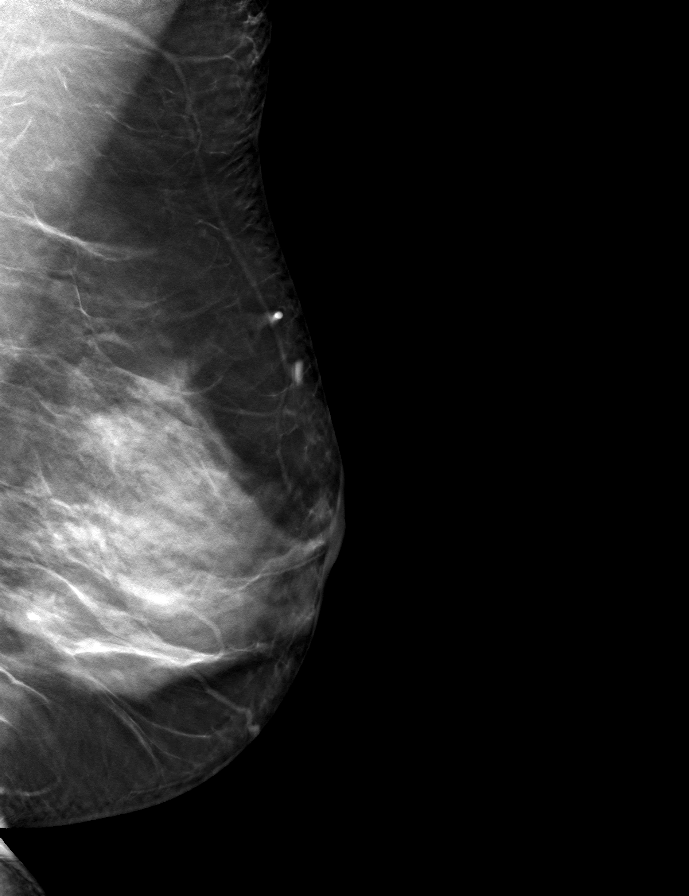
[frame 43/86]
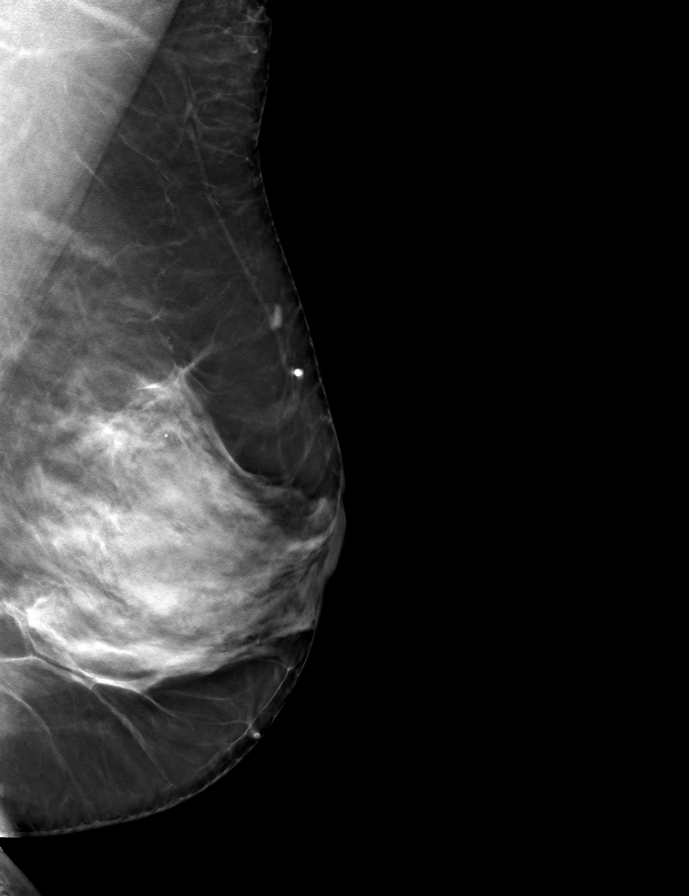

[L CC tomo · tomo slice 43/85.0]
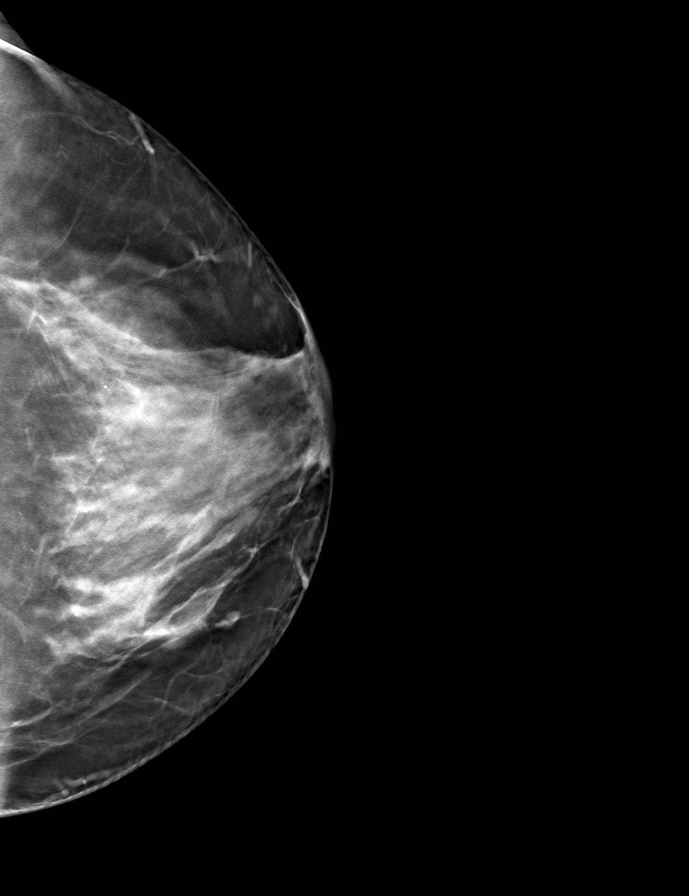

[R CC tomo · tomo slice 41/81.0]
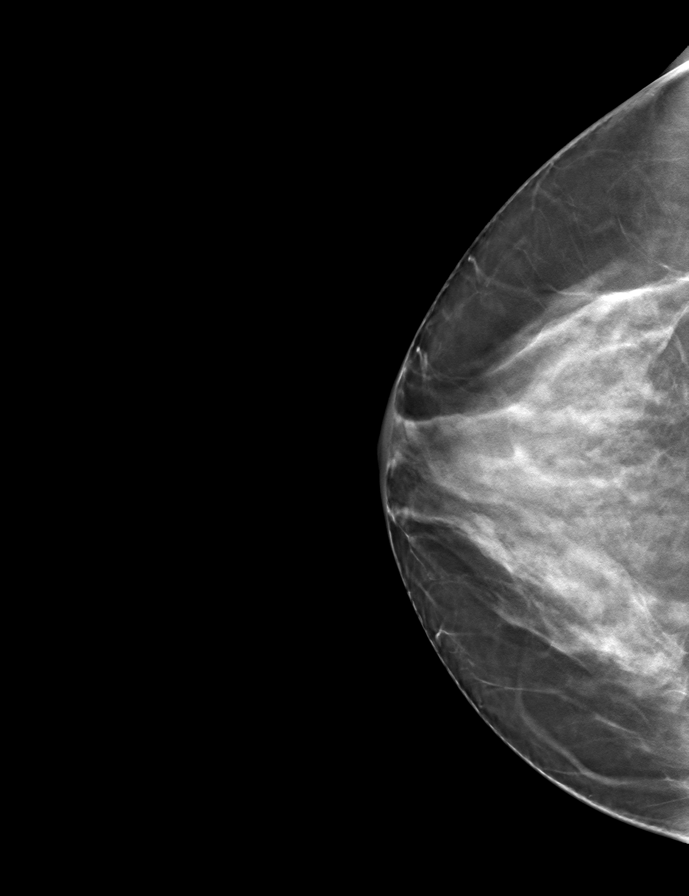

[R MLO tomo · tomo slice 43/85.0]
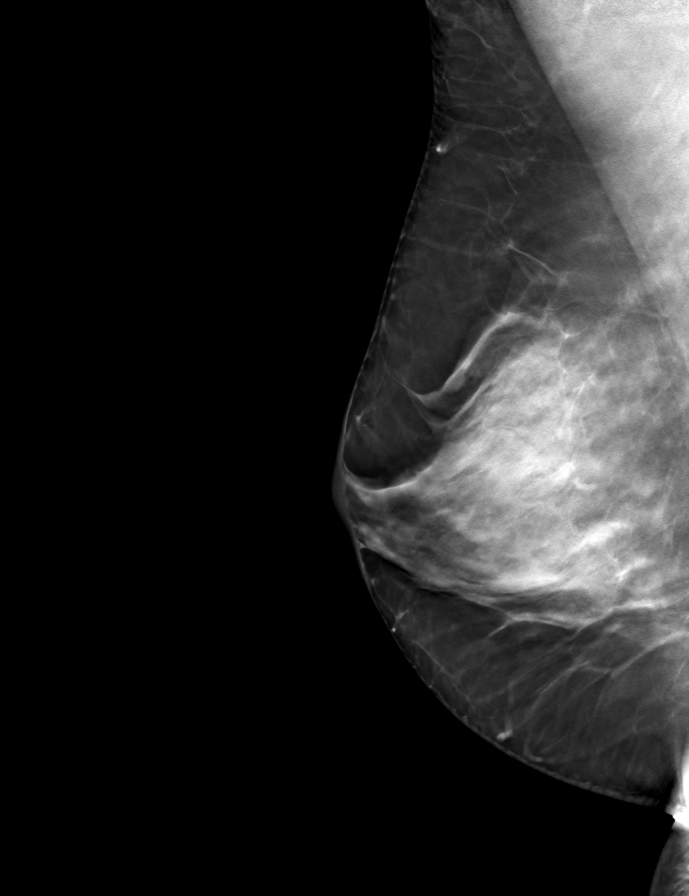

[9 of 24 positions shown; findings below may reference images not displayed]

ACR Breast Density Category c: The breast tissue is heterogeneously
dense, which may obscure small masses.
FINDINGS: There are no findings suspicious for malignancy.
IMPRESSION: No mammographic evidence of malignancy. A result letter of this
screening mammogram will be mailed directly to the patient.

RECOMMENDATION:
Screening mammogram in one year. (Code:[id number]-BC3)

BI-RADS CATEGORY  1: Negative.

## 2022-06-19 ENCOUNTER — Other Ambulatory Visit (HOSPITAL_BASED_OUTPATIENT_CLINIC_OR_DEPARTMENT_OTHER): Payer: Self-pay

## 2022-06-19 MED ORDER — ALBUTEROL SULFATE HFA 108 (90 BASE) MCG/ACT IN AERS
2.0000 | INHALATION_SPRAY | RESPIRATORY_TRACT | 0 refills | Status: AC | PRN
  Filled 2022-06-19: qty 6.7, 20d supply, fill #0

## 2022-06-20 ENCOUNTER — Other Ambulatory Visit (HOSPITAL_BASED_OUTPATIENT_CLINIC_OR_DEPARTMENT_OTHER): Payer: Self-pay

## 2022-06-20 MED ORDER — CULTURELLE DIGESTIVE DAILY PO CAPS
1.0000 | ORAL_CAPSULE | Freq: Every day | ORAL | 3 refills | Status: AC | PRN
  Filled 2022-06-20: qty 30, 30d supply, fill #0
  Filled 2022-09-24: qty 30, 30d supply, fill #1
  Filled 2022-12-08: qty 30, 30d supply, fill #2

## 2022-07-05 ENCOUNTER — Other Ambulatory Visit (HOSPITAL_BASED_OUTPATIENT_CLINIC_OR_DEPARTMENT_OTHER): Payer: Self-pay

## 2022-07-07 ENCOUNTER — Other Ambulatory Visit (HOSPITAL_BASED_OUTPATIENT_CLINIC_OR_DEPARTMENT_OTHER): Payer: Self-pay

## 2022-07-07 ENCOUNTER — Other Ambulatory Visit: Payer: Self-pay

## 2022-07-07 MED ORDER — NURTEC 75 MG PO TBDP
75.0000 mg | ORAL_TABLET | ORAL | 5 refills | Status: DC
  Filled 2022-07-07: qty 8, 8d supply, fill #0
  Filled 2022-08-05: qty 8, 8d supply, fill #1
  Filled 2022-10-24: qty 8, 8d supply, fill #2
  Filled 2022-12-08: qty 8, 8d supply, fill #3
  Filled 2023-02-17: qty 8, 8d supply, fill #4
  Filled 2023-05-31 – 2023-06-02 (×2): qty 8, 8d supply, fill #5

## 2022-07-10 ENCOUNTER — Other Ambulatory Visit (HOSPITAL_COMMUNITY): Payer: Self-pay

## 2022-07-10 ENCOUNTER — Other Ambulatory Visit (HOSPITAL_BASED_OUTPATIENT_CLINIC_OR_DEPARTMENT_OTHER): Payer: Self-pay

## 2022-07-10 MED ORDER — XIIDRA 5 % OP SOLN
1.0000 [drp] | Freq: Two times a day (BID) | OPHTHALMIC | 3 refills | Status: AC
  Filled 2022-07-10: qty 60, 30d supply, fill #0

## 2022-07-25 ENCOUNTER — Other Ambulatory Visit (HOSPITAL_BASED_OUTPATIENT_CLINIC_OR_DEPARTMENT_OTHER): Payer: Self-pay

## 2022-08-05 ENCOUNTER — Other Ambulatory Visit (HOSPITAL_BASED_OUTPATIENT_CLINIC_OR_DEPARTMENT_OTHER): Payer: Self-pay

## 2022-08-05 ENCOUNTER — Other Ambulatory Visit: Payer: Self-pay

## 2022-08-07 ENCOUNTER — Other Ambulatory Visit (HOSPITAL_COMMUNITY): Payer: Self-pay

## 2022-09-16 ENCOUNTER — Other Ambulatory Visit (HOSPITAL_BASED_OUTPATIENT_CLINIC_OR_DEPARTMENT_OTHER): Payer: Self-pay

## 2022-09-16 MED ORDER — PREDNISONE 50 MG PO TABS
50.0000 mg | ORAL_TABLET | Freq: Every day | ORAL | 0 refills | Status: DC
  Filled 2022-09-16: qty 5, 5d supply, fill #0

## 2022-09-18 ENCOUNTER — Other Ambulatory Visit (HOSPITAL_COMMUNITY): Payer: Self-pay

## 2022-09-18 ENCOUNTER — Other Ambulatory Visit (HOSPITAL_BASED_OUTPATIENT_CLINIC_OR_DEPARTMENT_OTHER): Payer: Self-pay

## 2022-09-18 MED ORDER — BENZONATATE 200 MG PO CAPS
200.0000 mg | ORAL_CAPSULE | Freq: Three times a day (TID) | ORAL | 0 refills | Status: AC
  Filled 2022-09-18 – 2022-09-19 (×4): qty 21, 7d supply, fill #0

## 2022-09-19 ENCOUNTER — Other Ambulatory Visit (HOSPITAL_BASED_OUTPATIENT_CLINIC_OR_DEPARTMENT_OTHER): Payer: Self-pay

## 2022-09-23 ENCOUNTER — Other Ambulatory Visit (HOSPITAL_BASED_OUTPATIENT_CLINIC_OR_DEPARTMENT_OTHER): Payer: Self-pay

## 2022-09-24 ENCOUNTER — Other Ambulatory Visit (HOSPITAL_BASED_OUTPATIENT_CLINIC_OR_DEPARTMENT_OTHER): Payer: Self-pay

## 2022-10-24 ENCOUNTER — Other Ambulatory Visit (HOSPITAL_COMMUNITY): Payer: Self-pay

## 2022-12-08 ENCOUNTER — Other Ambulatory Visit (HOSPITAL_COMMUNITY): Payer: Self-pay

## 2022-12-09 ENCOUNTER — Other Ambulatory Visit (HOSPITAL_COMMUNITY): Payer: Self-pay

## 2022-12-16 ENCOUNTER — Other Ambulatory Visit: Payer: Self-pay | Admitting: Obstetrics and Gynecology

## 2022-12-16 DIAGNOSIS — Z1231 Encounter for screening mammogram for malignant neoplasm of breast: Secondary | ICD-10-CM

## 2022-12-29 ENCOUNTER — Ambulatory Visit: Payer: 59 | Admitting: Dermatology

## 2022-12-29 ENCOUNTER — Other Ambulatory Visit (HOSPITAL_BASED_OUTPATIENT_CLINIC_OR_DEPARTMENT_OTHER): Payer: Self-pay

## 2022-12-29 ENCOUNTER — Encounter: Payer: Self-pay | Admitting: Dermatology

## 2022-12-29 VITALS — BP 147/98

## 2022-12-29 DIAGNOSIS — L564 Polymorphous light eruption: Secondary | ICD-10-CM

## 2022-12-29 DIAGNOSIS — L659 Nonscarring hair loss, unspecified: Secondary | ICD-10-CM

## 2022-12-29 MED ORDER — TRIAMCINOLONE ACETONIDE 0.1 % EX CREA
1.0000 | TOPICAL_CREAM | Freq: Two times a day (BID) | CUTANEOUS | 0 refills | Status: AC | PRN
  Filled 2022-12-29: qty 80, 30d supply, fill #0

## 2022-12-29 NOTE — Progress Notes (Signed)
   New Patient Visit   Subjective  Stephanie Schneider is a 57 y.o. female who presents for the following: Bumps on the arms x 4 months. She uses Triamcinolone cream as needed that was prescribed by her PCP. They do not itch. She thinks its from the sun and heat.    The following portions of the chart were reviewed this encounter and updated as appropriate: medications, allergies, medical history  Review of Systems:  No other skin or systemic complaints except as noted in HPI or Assessment and Plan.  Objective  Well appearing patient in no apparent distress; mood and affect are within normal limits.         A focused examination was performed of the following areas: arms  Relevant exam findings are noted in the Assessment and Plan.    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 The rash is non-itchy and responds well to Triamcinolone cream. Physical exam reveals fine, erythematous papules on sun-exposed areas of the arms.   - Continue using Triamcinolone cream as needed for flare-ups, up to twice a day for a maximum of two weeks per episode.   - Advise patient to use sunscreen with at least SPF 30, preferably mineral-based (e.g., Eucerin Sensitive Skin Mineral) to protect against UV radiation.   - Encourage wearing long sleeves, loose clothing, and UPF-protected clothing when outdoors, especially in sun-intense environments.   - Follow-up in a couple of weeks to monitor progress and discuss any concerns.  2. Hair Loss - Patient has a history of hair loss and is currently using topical Clobetasol.   - Bring the Clobetasol to the follow-up appointment in a couple of weeks.   - Discuss the possibility of using a compounded topical treatment containing Minoxidil, and Finasteride to avoid systemic side effects.   -  Schedule a follow-up appointment in a couple of weeks to assess the hair loss and adjust the treatment plan as needed.   Return in about 3 weeks (around 01/19/2023) for Hair Loss eval.  I, Mosetta Anis, CMA, am acting as scribe for [employer], DO.   Documentation: I have reviewed the above documentation for accuracy and completeness, and I agree with the above.  Langston Reusing, DO

## 2022-12-29 NOTE — Patient Instructions (Addendum)
Thank you for visiting my office today. I appreciate your commitment to addressing your skin concerns and am glad we could discuss your condition in detail.  Based on our conversation, here are the key instructions and recommendations for managing your condition:  - Diagnosis: Polymorphic Light Eruption (PMLE), characterized by sensitivity to sun exposure resulting in eczema-like rashes.  - Medication: Continue using Triamcinolone cream as needed during flare-ups. Apply twice a day for up to two weeks, although symptoms typically improve within a week.  - Sun Protection:   - Wear sunscreen regularly, especially when exposed to direct sunlight. I recommend using a mineral-based sunscreen like Eucerin Sensitive Skin Mineral to avoid skin irritation.   - Opt for long-sleeved, UPF-protected clothing to shield your skin from UV radiation.   - Prefer mineral sunscreens, particularly in ecologically sensitive areas like beaches or mountains.  - Follow-Up: Let's schedule a follow-up visit in a couple of weeks to monitor your progress and discuss additional treatments for your hair loss, including potential use of compounded topical medications like Clobetasol, Minoxidil, Doxycycline, and Finasteride.  Please ensure to follow these guidelines closely and reach out if you have any questions or need further assistance. Looking forward to seeing you again soon to continue our care plan.        Due to recent changes in healthcare laws, you may see results of your pathology and/or laboratory studies on MyChart before the doctors have had a chance to review them. We understand that in some cases there may be results that are confusing or concerning to you. Please understand that not all results are received at the same time and often the doctors may need to interpret multiple results in order to provide you with the best plan of care or course of treatment. Therefore, we ask that you please give [place] 2  business days to thoroughly review all your results before contacting the office for clarification. Should we see a critical lab result, you will be contacted sooner.   If You Need Anything After Your Visit  If you have any questions or concerns for your doctor, please call our main line at [phone] If no one answers, please leave a voicemail as directed and we will return your call as soon as possible. Messages left after 4 pm will be answered the following business day.   You may also send [place] a message via MyChart. We typically respond to MyChart messages within 1-2 business days.  For prescription refills, please ask your pharmacy to contact our office. Our fax number is [phone].  If you have an urgent issue when the clinic is closed that cannot wait until the next business day, you can page your doctor at the number below.    Please note that while we do our best to be available for urgent issues outside of office hours, we are not available 24/7.   If you have an urgent issue and are unable to reach [place], you may choose to seek medical care at your doctor's office, retail clinic, urgent care center, or emergency room.  If you have a medical emergency, please immediately call 911 or go to the emergency department. In the event of inclement weather, please call our main line at [phone] for an update on the status of any delays or closures.  Dermatology Medication Tips: Please keep the boxes that topical medications come in in order to help keep track of the instructions about where and how to use these. Pharmacies typically  print the medication instructions only on the boxes and not directly on the medication tubes.   If your medication is too expensive, please contact our office at [phone] or send [place] a message through MyChart.   We are unable to tell what your co-pay for medications will be in advance as this is different depending on your insurance coverage. However, we  may be able to find a substitute medication at lower cost or fill out paperwork to get insurance to cover a needed medication.   If a prior authorization is required to get your medication covered by your insurance company, please allow [place] 1-2 business days to complete this process.  Drug prices often vary depending on where the prescription is filled and some pharmacies may offer cheaper prices.  The website www.goodrx.com contains coupons for medications through different pharmacies. The prices here do not account for what the cost may be with help from insurance (it may be cheaper with your insurance), but the website can give you the price if you did not use any insurance.  - You can print the associated coupon and take it with your prescription to the pharmacy.  - You may also stop by our office during regular business hours and pick up a GoodRx coupon card.  - If you need your prescription sent electronically to a different pharmacy, notify our office through [facility] or by phone at [phone]

## 2023-01-16 ENCOUNTER — Other Ambulatory Visit (HOSPITAL_BASED_OUTPATIENT_CLINIC_OR_DEPARTMENT_OTHER): Payer: Self-pay

## 2023-01-16 MED ORDER — ALPRAZOLAM 0.25 MG PO TABS
0.2500 mg | ORAL_TABLET | Freq: Every day | ORAL | 1 refills | Status: AC | PRN
  Filled 2023-01-16: qty 30, 30d supply, fill #0

## 2023-01-20 ENCOUNTER — Encounter: Payer: Self-pay | Admitting: Dermatology

## 2023-01-20 ENCOUNTER — Ambulatory Visit (INDEPENDENT_AMBULATORY_CARE_PROVIDER_SITE_OTHER): Payer: 59 | Admitting: Dermatology

## 2023-01-20 DIAGNOSIS — L661 Lichen planopilaris: Secondary | ICD-10-CM

## 2023-01-20 NOTE — Progress Notes (Signed)
   Follow-Up Visit   Subjective  Stephanie Schneider is a 57 y.o. female who presents for the following: alopecia. Patient currently using clobetasol solution as needed for itch prescribed by Dr. Isaac Laud who she has been seeing for about 3 years, and using topical minoxidil 5%. Patient did take oral finasteride but could not tolerate due to breast tenderness. Also took Nutrafol in the past.   Patient has not done IL injections.   The patient has spots, moles and lesions to be evaluated, some may be new or changing and the patient may have concern these could be cancer.   The following portions of the chart were reviewed this encounter and updated as appropriate: medications, allergies, medical history  Review of Systems:  No other skin or systemic complaints except as noted in HPI or Assessment and Plan.  Objective  Well appearing patient in no apparent distress; mood and affect are within normal limits.   A focused examination was performed of the following areas: scalp  Relevant exam findings are noted in the Assessment and Plan.    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 - Plan: Continue using topical clobetasol for inflammation control. Consider morning application of [facility] Ultra Minoxidil Finasteride compound and clobetasol at night. Monitor for signs of itching, tenderness, or hair loss progression. If symptoms worsen, initiation of doxycycline and Kenalog injections may be considered for inflammation management.  Consider adding Nutrafol or Viviscal supplements to enhance hair growth and strength in combination with medical therapy. Also, consider incorporating collagen supplements, such as Vital Proteins, to support hair growth and strength.  Follow-up - Plan: Schedule a follow-up appointment in three months to assess the effectiveness of the current treatment regimen. Take comparison pictures during the follow-up visit for  the after-visit summary. If the treatment regimen proves effective, extend follow-up appointments to every four to six months.   Consider adding doxycycline and/or ILK injections if indicated.      Return in about 3 months (around 04/22/2023) for Alopecia.  Anise Salvo, RMA, am acting as scribe for [employer], DO .   Documentation: I have reviewed the above documentation for accuracy and completeness, and I agree with the above.  Langston Reusing, DO

## 2023-01-20 NOTE — Patient Instructions (Addendum)
Hello Stephanie Schneider,  Thank you for visiting our clinic today and for your commitment to addressing your hair loss concerns. It was a pleasure to discuss your current treatment and explore additional options to support your hair health. Here is a summary of the key instructions and recommendations from today's consultation on Lichen [place] Pilaris:  - Medications and Application:.   - Apply clobetasol at night to control inflammation.   - Start using [facility] (Minoxidil, Finasteride, spironolactone)compound in the morning.   - If you notice any increase in symptoms such as scalp itchiness, tenderness, or progression in hair loss, contact us for potential treatments with doxycycline or Kenalog injections.  - Supplements:   - Nutrafol (4 tablets daily) or Viviscal (2 tablets daily) can be added to your regimen to support hair growth and strength.   - Collagen supplements from [employer] can be added to your coffee or smoothie to help improve hair strength.  - Follow-Up Care:   - We will schedule a follow-up appointment in three months to assess the effectiveness of the regimen. If all is stable, subsequent visits will be scheduled every four to six months.   - We will take photographs during your next visit for comparison and include them in your after-visit summary.  - Additional Information:   - [facility] will contact you via text and email to confirm your address and payment details before shipping your medication.  Please ensure to apply the topical treatments as directed to avoid any unwanted hair growth in other areas. It typically takes about three to four months to assess whether a regimen is effective, so patience and consistency with the treatment are crucial.  We look forward to seeing you again and continuing to support your health. If you have any questions or concerns in the meantime, please do not hesitate to contact our office.     Instructions for [facility]  Medications  One or more of your medications was sent to the [facility] mail order compounding pharmacy. You will receive an email from them and can purchase the medicine through that link. It will then be mailed to your home at the address you confirmed. If for any reason you do not receive an email from them, please check your spam folder. If you still do not find the email, please let us know. [facility] phone number is [phone].    Due to recent changes in healthcare laws, you may see results of your pathology and/or laboratory studies on MyChart before the doctors have had a chance to review them. We understand that in some cases there may be results that are confusing or concerning to you. Please understand that not all results are received at the same time and often the doctors may need to interpret multiple results in order to provide you with the best plan of care or course of treatment. Therefore, we ask that you please give [place] 2 business days to thoroughly review all your results before contacting the office for clarification. Should we see a critical lab result, you will be contacted sooner.   If You Need Anything After Your Visit  If you have any questions or concerns for your doctor, please call our main line at [phone] If no one answers, please leave a voicemail as directed and we will return your call as soon as possible. Messages left after 4 pm will be answered the following business day.   You may also send [place] a message via  MyChart. We typically respond to MyChart messages within 1-2 business days.  For prescription refills, please ask your pharmacy to contact our office. Our fax number is [phone].  If you have an urgent issue when the clinic is closed that cannot wait until the next business day, you can page your doctor at the number below.    Please note that while we do our best to be available for urgent issues outside of office hours, we are not  available 24/7.   If you have an urgent issue and are unable to reach [place], you may choose to seek medical care at your doctor's office, retail clinic, urgent care center, or emergency room.  If you have a medical emergency, please immediately call 911 or go to the emergency department. In the event of inclement weather, please call our main line at [phone] for an update on the status of any delays or closures.  Dermatology Medication Tips: Please keep the boxes that topical medications come in in order to help keep track of the instructions about where and how to use these. Pharmacies typically print the medication instructions only on the boxes and not directly on the medication tubes.   If your medication is too expensive, please contact our office at [phone] or send [place] a message through MyChart.   We are unable to tell what your co-pay for medications will be in advance as this is different depending on your insurance coverage. However, we may be able to find a substitute medication at lower cost or fill out paperwork to get insurance to cover a needed medication.   If a prior authorization is required to get your medication covered by your insurance company, please allow [place] 1-2 business days to complete this process.  Drug prices often vary depending on where the prescription is filled and some pharmacies may offer cheaper prices.  The website www.goodrx.com contains coupons for medications through different pharmacies. The prices here do not account for what the cost may be with help from insurance (it may be cheaper with your insurance), but the website can give you the price if you did not use any insurance.  - You can print the associated coupon and take it with your prescription to the pharmacy.  - You may also stop by our office during regular business hours and pick up a GoodRx coupon card.  - If you need your prescription sent electronically to a different pharmacy, notify  our office through [facility] or by phone at [phone]

## 2023-01-21 ENCOUNTER — Ambulatory Visit: Payer: 59 | Admitting: Dermatology

## 2023-01-23 ENCOUNTER — Encounter: Payer: Self-pay | Admitting: Dermatology

## 2023-01-26 ENCOUNTER — Telehealth: Payer: Self-pay

## 2023-01-26 ENCOUNTER — Other Ambulatory Visit (HOSPITAL_BASED_OUTPATIENT_CLINIC_OR_DEPARTMENT_OTHER): Payer: Self-pay

## 2023-01-26 ENCOUNTER — Other Ambulatory Visit: Payer: Self-pay

## 2023-01-26 MED ORDER — CLOBETASOL PROPIONATE 0.05 % EX SOLN
1.0000 | Freq: Every day | CUTANEOUS | 5 refills | Status: AC
  Filled 2023-01-26 – 2023-01-29 (×2): qty 50, 30d supply, fill #0
  Filled 2023-10-26: qty 50, 30d supply, fill #1

## 2023-01-26 NOTE — Progress Notes (Signed)
Per Pt Clobetasol solution was not sent to her pharmacy. I checked and last fill was 07/25/2021. [facility].

## 2023-01-26 NOTE — Telephone Encounter (Signed)
The compound Hair Stim was not sent to her pharmacy. I called and asked them while you were away. Please send to pharmacy. I told the patient we will get back to her on Wednesday with the status of this.

## 2023-01-26 NOTE — Telephone Encounter (Signed)
Follow up note states to continue Clobetasol solution but it was not sent to the pharmacy. I spoke with her and let her know [facility]. She is also supposed to get [facility] Hair stim. I told her I will get back to her with the status.

## 2023-01-29 ENCOUNTER — Other Ambulatory Visit (HOSPITAL_COMMUNITY): Payer: Self-pay

## 2023-01-30 ENCOUNTER — Other Ambulatory Visit (HOSPITAL_BASED_OUTPATIENT_CLINIC_OR_DEPARTMENT_OTHER): Payer: Self-pay

## 2023-02-02 ENCOUNTER — Ambulatory Visit
Admission: RE | Admit: 2023-02-02 | Discharge: 2023-02-02 | Disposition: A | Discharge status: Home / Self Care | Payer: 59 | Source: Ambulatory Visit | Attending: Obstetrics and Gynecology | Admitting: Obstetrics and Gynecology

## 2023-02-02 DIAGNOSIS — Z1231 Encounter for screening mammogram for malignant neoplasm of breast: Secondary | ICD-10-CM

## 2023-02-03 ENCOUNTER — Telehealth: Payer: Self-pay

## 2023-02-03 NOTE — Telephone Encounter (Signed)
Yes, please send clobetasol if not already sent

## 2023-02-03 NOTE — Telephone Encounter (Signed)
 The compound Hair Stim was not sent to her pharmacy. I called and asked them while you were away. Please send to pharmacy. I told the patient we will get back to her on Wednesday with the status of this.

## 2023-02-03 NOTE — Telephone Encounter (Signed)
-----   Message from Langston Reusing sent at 02/02/2023  5:44 PM EDT ----- Regarding: RE: [facility] was sent on 8/7 ----- Message ----- From: Dorothy Spark, CMA Sent: 01/26/2023  10:14 AM EDT To: Terri Piedra, DO Subject: [facility]                                The compound Hair Stim was not sent to her pharmacy. I called and asked them while you were away. Please send to pharmacy. I told the patient we will get back to her on Wednesday with the status of this.

## 2023-02-03 NOTE — Telephone Encounter (Signed)
The hairstim compound from [facility] was sent on 01/28/23.  She should have it by now

## 2023-02-04 ENCOUNTER — Other Ambulatory Visit: Payer: Self-pay | Admitting: Obstetrics and Gynecology

## 2023-02-04 DIAGNOSIS — R928 Other abnormal and inconclusive findings on diagnostic imaging of breast: Secondary | ICD-10-CM

## 2023-02-06 ENCOUNTER — Ambulatory Visit
Admission: RE | Admit: 2023-02-06 | Discharge: 2023-02-06 | Disposition: A | Discharge status: Home / Self Care | Payer: 59 | Source: Ambulatory Visit | Attending: Obstetrics and Gynecology | Admitting: Obstetrics and Gynecology

## 2023-02-06 DIAGNOSIS — R928 Other abnormal and inconclusive findings on diagnostic imaging of breast: Secondary | ICD-10-CM

## 2023-02-13 ENCOUNTER — Other Ambulatory Visit: Payer: 59

## 2023-02-17 ENCOUNTER — Other Ambulatory Visit (HOSPITAL_BASED_OUTPATIENT_CLINIC_OR_DEPARTMENT_OTHER): Payer: Self-pay

## 2023-04-15 ENCOUNTER — Other Ambulatory Visit (HOSPITAL_COMMUNITY): Payer: Self-pay

## 2023-04-15 ENCOUNTER — Other Ambulatory Visit (HOSPITAL_BASED_OUTPATIENT_CLINIC_OR_DEPARTMENT_OTHER): Payer: Self-pay

## 2023-04-15 MED ORDER — PREDNISOLONE ACETATE 1 % OP SUSP
OPHTHALMIC | 0 refills | Status: AC
  Filled 2023-04-15: qty 10, 30d supply, fill #0
  Filled 2023-04-15: qty 5, 31d supply, fill #0

## 2023-04-22 ENCOUNTER — Encounter: Payer: Self-pay | Admitting: Dermatology

## 2023-04-22 ENCOUNTER — Ambulatory Visit: Payer: 59 | Admitting: Dermatology

## 2023-04-22 DIAGNOSIS — Z1283 Encounter for screening for malignant neoplasm of skin: Secondary | ICD-10-CM | POA: Diagnosis not present

## 2023-04-22 DIAGNOSIS — D1801 Hemangioma of skin and subcutaneous tissue: Secondary | ICD-10-CM

## 2023-04-22 DIAGNOSIS — L661 Lichen planopilaris, unspecified: Secondary | ICD-10-CM

## 2023-04-22 DIAGNOSIS — D229 Melanocytic nevi, unspecified: Secondary | ICD-10-CM

## 2023-04-22 DIAGNOSIS — L918 Other hypertrophic disorders of the skin: Secondary | ICD-10-CM

## 2023-04-22 DIAGNOSIS — L821 Other seborrheic keratosis: Secondary | ICD-10-CM

## 2023-04-22 DIAGNOSIS — D239 Other benign neoplasm of skin, unspecified: Secondary | ICD-10-CM

## 2023-04-22 DIAGNOSIS — L814 Other melanin hyperpigmentation: Secondary | ICD-10-CM | POA: Diagnosis not present

## 2023-04-22 NOTE — Progress Notes (Signed)
New Patient Visit   Subjective  Stephanie Schneider is a 57 y.o. female who presents for the following:  Total Body Skin Exam (TBSE)  Patient present today for new patient visit for TBSE.The patient reports she has spots, moles and lesions to be evaluated, some may be new or changing and the patient may have concern these could be cancer. Patient has previously been treated by a dermatologist.Patient reports she has hx of bx. Patient denies family history of skin cancers. Patient reports throughout her lifetime has had moderate sun exposure. Currently, patient reports if she has excessive sun exposure, she does apply sunscreen and/or wears protective coverings.   Patient is also following up for Alopecia. Patients last ov was 01/20/23. Patient reports her sxsx are unchanged. Patient states she applies the [facility] Compound Medication using it every other day. Patient reports she has started the VivaScal Vitamins and/or Vital Protein collagen supplements but she D/C'd due to causing stomach issues.   The following portions of the chart were reviewed this encounter and updated as appropriate: medications, allergies, medical history  Review of Systems:  No other skin or systemic complaints except as noted in HPI or Assessment and Plan.  Objective  Well appearing patient in no apparent distress; mood and affect are within normal limits.  A full examination was performed including scalp, head, eyes, ears, nose, lips, neck, chest, axillae, abdomen, back, buttocks, bilateral upper extremities, bilateral lower extremities, hands, feet, fingers, toes, fingernails, and toenails. All findings within normal limits unless otherwise noted below.     Relevant exam findings are noted in the Assessment and Plan.    Assessment & Plan   LENTIGINES, SEBORRHEIC KERATOSES, HEMANGIOMAS - Benign normal skin lesions - Benign-appearing - Call for any changes  BENIGN MELANOCYTIC NEVI - Tan-brown  and/or pink-flesh-colored symmetric macules and papules - Benign appearing on exam today - Observation - Call clinic for new or changing moles - Recommend daily use of broad spectrum spf 30+ sunscreen to sun-exposed areas.   NO ACTINIC DAMAGE   - Recommend daily broad spectrum sunscreen SPF 30+ to sun-exposed areas, reapply every 2 hours as needed.  - Staying in the shade or wearing long sleeves, sun glasses (UVA+UVB protection) and wide brim hats (4-inch brim around the entire circumference of the hat) are also recommended for sun protection.  - Call for new or changing lesions.  SKIN CANCER SCREENING PERFORMED TODAY  ACROCHORDONS (Skin Tags) - Fleshy, skin-colored pedunculated papules - Benign appearing.  - Observe. - If desired, they can be removed with an in office procedure that is not covered by insurance. - Please call the clinic if you notice any new or changing lesions.   DERMATOFIBROMA Exam: Firm pink/brown papulenodule with dimple sign. Treatment Plan: A dermatofibroma is a benign growth possibly related to trauma, such as an insect bite, cut from shaving, or inflamed acne-type bump.  Treatment options to remove include shave or excision with resulting scar and risk of recurrence.  Since benign-appearing and not bothersome, will observe for now.    LICHEN PLANOPILARIS Exam: Stable, no worsening hair loss  Treatment Plan: Continue morning application of [facility] Ultra Minoxidil Finasteride compound and clobetasol at night. Monitor for signs of itching, tenderness, or hair loss progression. If symptoms worsen, initiation of doxycycline and Kenalog injections may be considered for inflammation management.  Consider adding Nutrafol or Viviscal supplements to enhance hair growth and strength in combination with medical therapy. Also, consider incorporating collagen supplements, such as  Vital Proteins, to support hair growth and strength.   Return in about 6 months  (around 10/21/2023) for LICHEN PLANOPILARIS.   Documentation: I have reviewed the above documentation for accuracy and completeness, and I agree with the above.  Langston Reusing, DO

## 2023-04-22 NOTE — Patient Instructions (Signed)

## 2023-06-01 ENCOUNTER — Other Ambulatory Visit (HOSPITAL_COMMUNITY): Payer: Self-pay

## 2023-06-02 ENCOUNTER — Other Ambulatory Visit (HOSPITAL_COMMUNITY): Payer: Self-pay

## 2023-06-02 ENCOUNTER — Other Ambulatory Visit (HOSPITAL_BASED_OUTPATIENT_CLINIC_OR_DEPARTMENT_OTHER): Payer: Self-pay

## 2023-06-09 ENCOUNTER — Other Ambulatory Visit (HOSPITAL_COMMUNITY): Payer: Self-pay

## 2023-06-09 MED ORDER — VALACYCLOVIR HCL 500 MG PO TABS
1000.0000 mg | ORAL_TABLET | Freq: Two times a day (BID) | ORAL | 3 refills | Status: AC
  Filled 2023-06-09: qty 120, 30d supply, fill #0

## 2023-06-10 ENCOUNTER — Other Ambulatory Visit (HOSPITAL_COMMUNITY): Payer: Self-pay

## 2023-07-15 ENCOUNTER — Other Ambulatory Visit: Payer: Self-pay

## 2023-07-15 ENCOUNTER — Encounter: Payer: Self-pay | Admitting: Dermatology

## 2023-07-19 ENCOUNTER — Other Ambulatory Visit (HOSPITAL_BASED_OUTPATIENT_CLINIC_OR_DEPARTMENT_OTHER): Payer: Self-pay

## 2023-07-20 ENCOUNTER — Other Ambulatory Visit (HOSPITAL_BASED_OUTPATIENT_CLINIC_OR_DEPARTMENT_OTHER): Payer: Self-pay

## 2023-07-20 ENCOUNTER — Other Ambulatory Visit (HOSPITAL_COMMUNITY): Payer: Self-pay

## 2023-07-20 MED ORDER — NURTEC 75 MG PO TBDP
75.0000 mg | ORAL_TABLET | Freq: Every day | ORAL | 5 refills | Status: AC | PRN
  Filled 2023-07-20: qty 8, 30d supply, fill #0
  Filled 2023-07-20: qty 8, 8d supply, fill #0
  Filled 2023-07-20: qty 8, 30d supply, fill #0
  Filled 2023-07-22: qty 8, 8d supply, fill #0
  Filled 2023-10-26: qty 8, 8d supply, fill #1
  Filled 2024-02-20: qty 8, 8d supply, fill #2
  Filled 2024-05-21: qty 8, 8d supply, fill #3

## 2023-07-22 ENCOUNTER — Other Ambulatory Visit (HOSPITAL_BASED_OUTPATIENT_CLINIC_OR_DEPARTMENT_OTHER): Payer: Self-pay

## 2023-07-22 ENCOUNTER — Other Ambulatory Visit (HOSPITAL_COMMUNITY): Payer: Self-pay

## 2023-07-22 NOTE — Telephone Encounter (Signed)
Hi Clydie Braun,  Please let the pt know that unfortunately, I cannot refill a medicaiton I have never prescribed.  I read through her chart and did see where we discussed or treated this condition involving her inner thighs.  I'm more than happy to address it at her follow up appointment but I cannot send a script for something I have not seen, assessed, diagnosed and documented the therapy.  She can try calling the pharmacy or the original prescriber for a refill.  Thanks :)

## 2023-08-10 ENCOUNTER — Other Ambulatory Visit (HOSPITAL_BASED_OUTPATIENT_CLINIC_OR_DEPARTMENT_OTHER): Payer: Self-pay

## 2023-10-01 ENCOUNTER — Other Ambulatory Visit (HOSPITAL_COMMUNITY): Payer: Self-pay

## 2023-10-01 MED ORDER — CYCLOBENZAPRINE HCL 10 MG PO TABS
10.0000 mg | ORAL_TABLET | Freq: Three times a day (TID) | ORAL | 0 refills | Status: AC | PRN
  Filled 2023-10-01: qty 42, 14d supply, fill #0

## 2023-10-21 ENCOUNTER — Other Ambulatory Visit (HOSPITAL_COMMUNITY): Payer: Self-pay

## 2023-10-21 ENCOUNTER — Encounter: Payer: Self-pay | Admitting: Dermatology

## 2023-10-21 ENCOUNTER — Other Ambulatory Visit: Payer: Self-pay

## 2023-10-21 ENCOUNTER — Ambulatory Visit: Payer: 59 | Admitting: Dermatology

## 2023-10-21 VITALS — BP 128/88

## 2023-10-21 DIAGNOSIS — L739 Follicular disorder, unspecified: Secondary | ICD-10-CM | POA: Diagnosis not present

## 2023-10-21 DIAGNOSIS — L918 Other hypertrophic disorders of the skin: Secondary | ICD-10-CM | POA: Diagnosis not present

## 2023-10-21 DIAGNOSIS — L661 Lichen planopilaris, unspecified: Secondary | ICD-10-CM

## 2023-10-21 MED ORDER — CLINDAMYCIN PHOSPHATE 1 % EX SOLN
Freq: Two times a day (BID) | CUTANEOUS | 4 refills | Status: AC
  Filled 2023-10-21: qty 30, 15d supply, fill #0

## 2023-10-21 NOTE — Progress Notes (Signed)
   Follow-Up Visit   Subjective  Stephanie Schneider is a 58 y.o. 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 Patient was last evaluated on 04/22/2023. At this visit patient was prescribed Skin Medicinals Ultra Minoxidil . Patient reports sxs are better. Patient denies medication changes.Patient stated that she uses Nutrafol at dinner time.  The following portions of the chart were reviewed this encounter and updated as appropriate: medications, allergies, medical history  Review of Systems:  No other skin or systemic complaints except as noted in HPI or Assessment and Plan.  Objective  Well appearing patient in no apparent distress; mood and affect are within normal limits.  A focused examination was performed of the following areas: Scalp  Relevant exam findings are noted in the Assessment and Plan.             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 Lichen planopilaris - Assessment: Patient has been using prescribed minoxidil and clobetasol  drops for lichen planopilaris. Current symptoms and treatment efficacy were not explicitly stated. A comparison photo will be taken today to assess progress. - Plan:    Continue clobetasol  drops 2-3 times per week for maintenance and prevention of inflammation    Continue minoxidil (frequency not specified)    Continue finasteride to prevent hormonal hair thinning    Consider platelet-rich plasma (PRP) therapy for hair growth     - Involves drawing blood, separating it, and injecting platelet growth factors into the scalp     - Typically requires 3 sessions, 4-6 weeks apart    Follow up in 6 months    Patient to send MyChart message if medication refills needed before next appointment  2. Folliculitis - Assessment: Patient has  been experiencing breakouts on inner thighs, previously treated with clindamycin  for folliculitis. - Plan:    Refill clindamycin  prescription    Use clindamycin  during flares    Wash with benzoyl peroxide after workouts to prevent flares    Provide samples of CeraVe Acne Foaming Face Wash  3. Skin tags - Assessment: Patient inquired about skin tag removal options. - Plan:    Offer cosmetic removal of skin tags     - Starting at $200 for up to 15 tags     - Procedure involves numbing cream and possible cauterization of the base    Advise against over-the-counter solutions due to risk of contact dermatitis    Inform patient about alternative method of tying a string around the tag, noting it takes 1-2 weeks       SKIN TAG   LICHEN PLANOPILARIS   FOLLICULITIS    Return in about 6 months (around 04/21/2024) for Linhen Planopilaris.  Exie Holler, CMA, am acting as scribe for [employer], DO.   Documentation: I have reviewed the above documentation for accuracy and completeness, and I agree with the above.  Louana Roup, DO

## 2023-10-21 NOTE — Patient Instructions (Addendum)
 Hello Stephanie Schneider,  Thank you for visiting today. Here is a summary of the key instructions:  Medications: - Continue using clobetasol  drops 2-3 times a week for maintenance and prevention - Keep using minoxidil / Finasteride as prescribed (new rx was sent to [facility]) - Use clindamycin  for inner thigh folliculitis during flares  Skin Care: - Wash with benzoyl peroxide after workouts to prevent folliculitis flares - Use CeraVe Acne Foaming Face Wash samples as provided  Treatment Options: - Consider platelet-rich plasma (PRP) treatment for hair growth   - Involves 3 sessions, 4-6 weeks apart  Follow-up: - Next appointment in 6 months - Send a MyChart message if you need prescription refills before then  We look forward to seeing you at your next visit. If you have any questions or concerns before then, please do not hesitate to contact our office.  Warm regards,  Dr. Louana Roup Dermatology Important Information  Due to recent changes in healthcare laws, you may see results of your pathology and/or laboratory studies on MyChart before the doctors have had a chance to review them. We understand that in some cases there may be results that are confusing or concerning to you. Please understand that not all results are received at the same time and often the doctors may need to interpret multiple results in order to provide you with the best plan of care or course of treatment. Therefore, we ask that you please give us  2 business days to thoroughly review all your results before contacting the office for clarification. Should we see a critical lab result, you will be contacted sooner.   If You Need Anything After Your Visit  If you have any questions or concerns for your doctor, please call our main line at [phone] If no one answers, please leave a voicemail as directed and we will return your call as soon as possible. Messages left after 4 pm will be answered the following business  day.   You may also send us  a message via MyChart. We typically respond to MyChart messages within 1-2 business days.  For prescription refills, please ask your pharmacy to contact our office. Our fax number is [phone].  If you have an urgent issue when the clinic is closed that cannot wait until the next business day, you can page your doctor at the number below.    Please note that while we do our best to be available for urgent issues outside of office hours, we are not available 24/7.   If you have an urgent issue and are unable to reach us , you may choose to seek medical care at your doctor's office, retail clinic, urgent care center, or emergency room.  If you have a medical emergency, please immediately call 911 or go to the emergency department. In the event of inclement weather, please call our main line at [phone] for an update on the status of any delays or closures.  Dermatology Medication Tips: Please keep the boxes that topical medications come in in order to help keep track of the instructions about where and how to use these. Pharmacies typically print the medication instructions only on the boxes and not directly on the medication tubes.   If your medication is too expensive, please contact our office at [phone] or send us  a message through MyChart.   We are unable to tell what your co-pay for medications will be in advance as this is different depending on your insurance coverage. However, we may be able  to find a substitute medication at lower cost or fill out paperwork to get insurance to cover a needed medication.   If a prior authorization is required to get your medication covered by your insurance company, please allow us  1-2 business days to complete this process.  Drug prices often vary depending on where the prescription is filled and some pharmacies may offer cheaper prices.  The website www.goodrx.com contains coupons for medications through  different pharmacies. The prices here do not account for what the cost may be with help from insurance (it may be cheaper with your insurance), but the website can give you the price if you did not use any insurance.  - You can print the associated coupon and take it with your prescription to the pharmacy.  - You may also stop by our office during regular business hours and pick up a GoodRx coupon card.  - If you need your prescription sent electronically to a different pharmacy, notify our office through [facility] or by phone at [phone]

## 2023-10-26 ENCOUNTER — Other Ambulatory Visit (HOSPITAL_COMMUNITY): Payer: Self-pay

## 2023-10-26 ENCOUNTER — Other Ambulatory Visit: Payer: Self-pay

## 2023-10-26 ENCOUNTER — Encounter: Payer: Self-pay | Admitting: Dermatology

## 2023-10-26 NOTE — Telephone Encounter (Signed)
 Hi Hollie,  It looks like ashley forgot to send the [facility] script for Minoxidi 8% and Finasteride.  Can you please send it and notify pt once it's sent?  Thanks!

## 2023-10-27 ENCOUNTER — Other Ambulatory Visit: Payer: Self-pay

## 2023-11-02 ENCOUNTER — Other Ambulatory Visit (HOSPITAL_COMMUNITY): Payer: Self-pay

## 2023-11-02 ENCOUNTER — Other Ambulatory Visit: Payer: Self-pay

## 2023-11-02 MED ORDER — VITAMIN D (ERGOCALCIFEROL) 50000 UNITS PO CAPS
50000.0000 [IU] | ORAL_CAPSULE | ORAL | 3 refills | Status: AC
  Filled 2023-11-02: qty 4, 28d supply, fill #0
  Filled 2023-11-25: qty 4, 28d supply, fill #1

## 2023-11-03 MED ORDER — SAFETY SEAL MISCELLANEOUS MISC: 1.0000 | Freq: Every day | 0 refills | Status: DC

## 2023-11-05 ENCOUNTER — Other Ambulatory Visit (HOSPITAL_BASED_OUTPATIENT_CLINIC_OR_DEPARTMENT_OTHER): Payer: Self-pay

## 2023-11-10 ENCOUNTER — Other Ambulatory Visit (HOSPITAL_COMMUNITY): Payer: Self-pay

## 2023-11-10 ENCOUNTER — Other Ambulatory Visit (HOSPITAL_BASED_OUTPATIENT_CLINIC_OR_DEPARTMENT_OTHER): Payer: Self-pay

## 2023-11-10 MED ORDER — CULTURELLE IMMUNITY SUPPORT PO CAPS
1.0000 | ORAL_CAPSULE | Freq: Every day | ORAL | 3 refills | Status: AC | PRN
  Filled 2023-11-10: qty 30, 30d supply, fill #0

## 2023-11-11 ENCOUNTER — Other Ambulatory Visit (HOSPITAL_COMMUNITY): Payer: Self-pay

## 2023-11-11 ENCOUNTER — Other Ambulatory Visit: Payer: Self-pay

## 2023-11-11 ENCOUNTER — Other Ambulatory Visit (HOSPITAL_BASED_OUTPATIENT_CLINIC_OR_DEPARTMENT_OTHER): Payer: Self-pay

## 2023-11-11 MED ORDER — ALPRAZOLAM 0.25 MG PO TABS
0.2500 mg | ORAL_TABLET | Freq: Every day | ORAL | 0 refills | Status: AC | PRN
  Filled 2023-11-11 – 2023-11-13 (×2): qty 30, 30d supply, fill #0

## 2023-11-12 ENCOUNTER — Other Ambulatory Visit (HOSPITAL_BASED_OUTPATIENT_CLINIC_OR_DEPARTMENT_OTHER): Payer: Self-pay

## 2023-11-13 ENCOUNTER — Other Ambulatory Visit (HOSPITAL_COMMUNITY): Payer: Self-pay

## 2023-11-25 ENCOUNTER — Other Ambulatory Visit (HOSPITAL_COMMUNITY): Payer: Self-pay

## 2024-01-01 ENCOUNTER — Other Ambulatory Visit: Payer: Self-pay | Admitting: Obstetrics and Gynecology

## 2024-01-01 DIAGNOSIS — Z1231 Encounter for screening mammogram for malignant neoplasm of breast: Secondary | ICD-10-CM

## 2024-01-08 ENCOUNTER — Other Ambulatory Visit (HOSPITAL_BASED_OUTPATIENT_CLINIC_OR_DEPARTMENT_OTHER): Payer: Self-pay

## 2024-02-02 ENCOUNTER — Other Ambulatory Visit: Payer: Self-pay

## 2024-02-02 MED ORDER — SAFETY SEAL MISCELLANEOUS MISC: 1.0000 | Freq: Every day | 1 refills | Status: DC

## 2024-02-02 NOTE — Progress Notes (Signed)
 Pt requested refill.

## 2024-02-03 ENCOUNTER — Ambulatory Visit
Admission: RE | Admit: 2024-02-03 | Discharge: 2024-02-03 | Disposition: A | Discharge status: Home / Self Care | Source: Ambulatory Visit | Attending: Obstetrics and Gynecology | Admitting: Obstetrics and Gynecology

## 2024-02-03 DIAGNOSIS — Z1231 Encounter for screening mammogram for malignant neoplasm of breast: Secondary | ICD-10-CM

## 2024-04-07 ENCOUNTER — Other Ambulatory Visit (HOSPITAL_COMMUNITY): Payer: Self-pay

## 2024-04-07 MED ORDER — VALACYCLOVIR HCL 500 MG PO TABS
1000.0000 mg | ORAL_TABLET | Freq: Two times a day (BID) | ORAL | 3 refills | Status: AC
  Filled 2024-04-07: qty 30, 8d supply, fill #0

## 2024-04-21 ENCOUNTER — Ambulatory Visit: Payer: 59 | Admitting: Dermatology

## 2024-04-21 ENCOUNTER — Encounter: Payer: Self-pay | Admitting: Dermatology

## 2024-04-21 VITALS — BP 150/102

## 2024-04-21 DIAGNOSIS — L814 Other melanin hyperpigmentation: Secondary | ICD-10-CM | POA: Diagnosis not present

## 2024-04-21 DIAGNOSIS — D2372 Other benign neoplasm of skin of left lower limb, including hip: Secondary | ICD-10-CM

## 2024-04-21 DIAGNOSIS — D239 Other benign neoplasm of skin, unspecified: Secondary | ICD-10-CM

## 2024-04-21 DIAGNOSIS — D1801 Hemangioma of skin and subcutaneous tissue: Secondary | ICD-10-CM

## 2024-04-21 DIAGNOSIS — L821 Other seborrheic keratosis: Secondary | ICD-10-CM | POA: Diagnosis not present

## 2024-04-21 DIAGNOSIS — Z1283 Encounter for screening for malignant neoplasm of skin: Secondary | ICD-10-CM

## 2024-04-21 DIAGNOSIS — L739 Follicular disorder, unspecified: Secondary | ICD-10-CM

## 2024-04-21 DIAGNOSIS — W908XXA Exposure to other nonionizing radiation, initial encounter: Secondary | ICD-10-CM

## 2024-04-21 DIAGNOSIS — L661 Lichen planopilaris, unspecified: Secondary | ICD-10-CM

## 2024-04-21 DIAGNOSIS — D229 Melanocytic nevi, unspecified: Secondary | ICD-10-CM

## 2024-04-21 DIAGNOSIS — L578 Other skin changes due to chronic exposure to nonionizing radiation: Secondary | ICD-10-CM

## 2024-04-21 MED ORDER — SAFETY SEAL MISCELLANEOUS MISC: 1.0000 | Freq: Every day | 1 refills | Status: AC

## 2024-04-21 MED ORDER — SAFETY SEAL MISCELLANEOUS MISC: 1.0000 | Freq: Every day | 1 refills | Status: DC

## 2024-04-21 NOTE — Progress Notes (Signed)
   Total Body Skin Exam (TBSE) Visit   Subjective  Stephanie Schneider is a 58 y.o. female ESTABLISHED PATIENT who presents for the following:  Total Body Skin Exam (TBSE)  Patient was last evaluated for TBSE on 04/22/23 .  Patient does not have spots of concern to be evaluated. She does apply sunscreen and/or wears protective coverings. Hx of Bx. No family Hx of skin cancers.   The patient has spots, moles and lesions to be evaluated, some may be new or changing and the patient has concerns that these could be cancer.  The following portions of the chart were reviewed this encounter and updated as appropriate: medications, allergies, medical history  Review of Systems:  No other skin or systemic complaints except as noted in HPI or Assessment and Plan.  Objective  Well appearing patient in no apparent distress; mood and affect are within normal limits.  A full examination was performed including scalp, head, eyes, ears, nose, lips, neck, chest, axillae, abdomen, back, buttocks, bilateral upper extremities, bilateral lower extremities, hands, feet, fingers, toes, fingernails, and toenails. All findings within normal limits unless otherwise noted below.   Relevant physical exam findings are noted in the Assessment and Plan.    Assessment & Plan   LENTIGINES, SEBORRHEIC KERATOSES, HEMANGIOMAS - Benign normal skin lesions - Benign-appearing - Call for any changes  BENIGN MELANOCYTIC NEVI - Tan-brown and/or pink-flesh-colored symmetric macules and papules - Benign appearing on exam today - Observation - Call clinic for new or changing moles - Recommend daily use of broad spectrum spf 30+ sunscreen to sun-exposed areas.   MILD ACTINIC DAMAGE - Chronic condition, secondary to cumulative UV/sun exposure - diffuse scaly erythematous macules with underlying dyspigmentation - Recommend daily broad spectrum sunscreen SPF 30+ to sun-exposed areas, reapply every 2 hours as needed.   - Staying in the shade or wearing long sleeves, sun glasses (UVA+UVB protection) and wide brim hats (4-inch brim around the entire circumference of the hat) are also recommended for sun protection.  - Call for new or changing lesions.  Lichen Planopilaris  Exam: stable  Treatment Plan: - Changed Medrock compound to Minoxidil 8%Finasteride 1% Metformin USP 10% solution - apply to the affected areas of the scalp every other morning.  FOLLICULITIS Exam: Perifollicular erythematous papules and pustules  Treatment Plan: - Rec washing areas with BPO. CeraVe BPO samples provided.  DERMATOFIBROMA Exam: Firm pink/brown papulenodule with dimple sign at L leg  Treatment Plan: A dermatofibroma is a benign growth possibly related to trauma, such as an insect bite, cut from shaving, or inflamed acne-type bump.  Treatment options to remove include shave or excision with resulting scar and risk of recurrence.  Since benign-appearing and not bothersome, will observe for now.   SKIN CANCER SCREENING PERFORMED TODAY.   Return in about 2 years (around 04/21/2026) for TBSE & Lichen Planopilaris in 30yr.   Documentation: I have reviewed the above documentation for accuracy and completeness, and I agree with the above.  I, Sabeen Piechocki Maranda, CMA, am acting as scribe for [employer], DO.   Delon Lenis, DO

## 2024-04-21 NOTE — Patient Instructions (Signed)

## 2024-05-05 ENCOUNTER — Other Ambulatory Visit (HOSPITAL_COMMUNITY): Payer: Self-pay

## 2024-05-05 MED ORDER — XIIDRA 5 % OP SOLN
1.0000 [drp] | Freq: Two times a day (BID) | OPHTHALMIC | 3 refills | Status: AC
  Filled 2024-05-05: qty 60, 30d supply, fill #0
  Filled 2024-07-26: qty 60, 30d supply, fill #1

## 2024-05-05 MED ORDER — PREDNISOLONE ACETATE 1 % OP SUSP
OPHTHALMIC | 0 refills | Status: AC
  Filled 2024-05-05: qty 5, 17d supply, fill #0

## 2024-05-21 ENCOUNTER — Other Ambulatory Visit (HOSPITAL_COMMUNITY): Payer: Self-pay

## 2024-05-24 ENCOUNTER — Other Ambulatory Visit (HOSPITAL_COMMUNITY): Payer: Self-pay

## 2024-07-26 ENCOUNTER — Other Ambulatory Visit (HOSPITAL_COMMUNITY): Payer: Self-pay

## 2025-04-24 ENCOUNTER — Ambulatory Visit: Admitting: Dermatology
